# Patient Record
Sex: Female | Born: 1950 | Race: Black or African American | Hispanic: No | Marital: Married | Smoking: Never smoker
Health system: Southern US, Community
[De-identification: ages and names within clinical notes are randomized; demographics above are authoritative.]

## PROBLEM LIST (undated history)

## (undated) DIAGNOSIS — E876 Hypokalemia: Secondary | ICD-10-CM

## (undated) DIAGNOSIS — Z8739 Personal history of other diseases of the musculoskeletal system and connective tissue: Secondary | ICD-10-CM

## (undated) DIAGNOSIS — E559 Vitamin D deficiency, unspecified: Secondary | ICD-10-CM

## (undated) DIAGNOSIS — J45909 Unspecified asthma, uncomplicated: Secondary | ICD-10-CM

## (undated) DIAGNOSIS — E7800 Pure hypercholesterolemia, unspecified: Secondary | ICD-10-CM

## (undated) DIAGNOSIS — D219 Benign neoplasm of connective and other soft tissue, unspecified: Secondary | ICD-10-CM

## (undated) DIAGNOSIS — E870 Hyperosmolality and hypernatremia: Secondary | ICD-10-CM

## (undated) DIAGNOSIS — I10 Essential (primary) hypertension: Secondary | ICD-10-CM

## (undated) DIAGNOSIS — T7840XA Allergy, unspecified, initial encounter: Secondary | ICD-10-CM

## (undated) DIAGNOSIS — J301 Allergic rhinitis due to pollen: Secondary | ICD-10-CM

## (undated) DIAGNOSIS — R002 Palpitations: Secondary | ICD-10-CM

## (undated) HISTORY — DX: Palpitations: R00.2

## (undated) HISTORY — DX: Vitamin D deficiency, unspecified: E55.9

## (undated) HISTORY — DX: Hypokalemia: E87.6

## (undated) HISTORY — DX: Pure hypercholesterolemia, unspecified: E78.00

## (undated) HISTORY — DX: Essential (primary) hypertension: I10

## (undated) HISTORY — DX: Benign neoplasm of connective and other soft tissue, unspecified: D21.9

## (undated) HISTORY — DX: Personal history of other diseases of the musculoskeletal system and connective tissue: Z87.39

## (undated) HISTORY — DX: Allergy, unspecified, initial encounter: T78.40XA

## (undated) HISTORY — DX: Hyperosmolality and hypernatremia: E87.0

## (undated) HISTORY — DX: Allergic rhinitis due to pollen: J30.1

## (undated) HISTORY — PX: WISDOM TOOTH EXTRACTION: SHX21

## (undated) HISTORY — PX: TONSILLECTOMY: SUR1361

## (undated) HISTORY — DX: Unspecified asthma, uncomplicated: J45.909

---
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
## 1998-01-08 ENCOUNTER — Ambulatory Visit (HOSPITAL_COMMUNITY): Admission: RE | Admit: 1998-01-08 | Discharge: 1998-01-08 | Payer: Self-pay | Admitting: Internal Medicine

## 1998-02-10 ENCOUNTER — Ambulatory Visit (HOSPITAL_COMMUNITY): Admission: RE | Admit: 1998-02-10 | Discharge: 1998-02-10 | Payer: Self-pay | Admitting: Internal Medicine

## 2000-11-19 ENCOUNTER — Other Ambulatory Visit: Admission: RE | Admit: 2000-11-19 | Discharge: 2000-11-19 | Payer: Self-pay | Admitting: Obstetrics and Gynecology

## 2001-02-13 ENCOUNTER — Encounter: Admission: RE | Admit: 2001-02-13 | Discharge: 2001-02-13 | Payer: Self-pay | Admitting: Internal Medicine

## 2001-02-13 ENCOUNTER — Encounter: Payer: Self-pay | Admitting: Internal Medicine

## 2001-11-19 ENCOUNTER — Other Ambulatory Visit: Admission: RE | Admit: 2001-11-19 | Discharge: 2001-11-19 | Payer: Self-pay | Admitting: Obstetrics and Gynecology

## 2002-02-14 ENCOUNTER — Ambulatory Visit (HOSPITAL_COMMUNITY): Admission: RE | Admit: 2002-02-14 | Discharge: 2002-02-14 | Payer: Self-pay | Admitting: Obstetrics and Gynecology

## 2002-02-14 ENCOUNTER — Encounter: Payer: Self-pay | Admitting: Obstetrics and Gynecology

## 2002-04-28 ENCOUNTER — Ambulatory Visit (HOSPITAL_COMMUNITY): Admission: RE | Admit: 2002-04-28 | Discharge: 2002-04-28 | Payer: Self-pay | Admitting: Internal Medicine

## 2002-12-10 ENCOUNTER — Other Ambulatory Visit: Admission: RE | Admit: 2002-12-10 | Discharge: 2002-12-10 | Payer: Self-pay | Admitting: Obstetrics and Gynecology

## 2003-02-17 ENCOUNTER — Encounter: Payer: Self-pay | Admitting: Obstetrics and Gynecology

## 2003-02-17 ENCOUNTER — Ambulatory Visit (HOSPITAL_COMMUNITY): Admission: RE | Admit: 2003-02-17 | Discharge: 2003-02-17 | Payer: Self-pay | Admitting: Obstetrics and Gynecology

## 2003-06-22 ENCOUNTER — Ambulatory Visit (HOSPITAL_COMMUNITY): Admission: RE | Admit: 2003-06-22 | Discharge: 2003-06-22 | Payer: Self-pay | Admitting: Gastroenterology

## 2003-12-11 ENCOUNTER — Other Ambulatory Visit: Admission: RE | Admit: 2003-12-11 | Discharge: 2003-12-11 | Payer: Self-pay | Admitting: Obstetrics and Gynecology

## 2004-02-03 ENCOUNTER — Encounter: Admission: RE | Admit: 2004-02-03 | Discharge: 2004-02-03 | Payer: Self-pay | Admitting: Internal Medicine

## 2004-02-19 ENCOUNTER — Ambulatory Visit (HOSPITAL_COMMUNITY): Admission: RE | Admit: 2004-02-19 | Discharge: 2004-02-19 | Payer: Self-pay | Admitting: Obstetrics and Gynecology

## 2004-12-14 ENCOUNTER — Other Ambulatory Visit: Admission: RE | Admit: 2004-12-14 | Discharge: 2004-12-14 | Payer: Self-pay | Admitting: Obstetrics and Gynecology

## 2005-02-28 ENCOUNTER — Ambulatory Visit (HOSPITAL_COMMUNITY): Admission: RE | Admit: 2005-02-28 | Discharge: 2005-02-28 | Payer: Self-pay | Admitting: Obstetrics and Gynecology

## 2005-03-07 ENCOUNTER — Encounter: Admission: RE | Admit: 2005-03-07 | Discharge: 2005-03-07 | Payer: Self-pay | Admitting: Obstetrics and Gynecology

## 2005-05-02 ENCOUNTER — Encounter: Admission: RE | Admit: 2005-05-02 | Discharge: 2005-05-02 | Payer: Self-pay | Admitting: Internal Medicine

## 2005-12-15 ENCOUNTER — Other Ambulatory Visit: Admission: RE | Admit: 2005-12-15 | Discharge: 2005-12-15 | Payer: Self-pay | Admitting: Obstetrics and Gynecology

## 2006-03-01 ENCOUNTER — Ambulatory Visit (HOSPITAL_COMMUNITY): Admission: RE | Admit: 2006-03-01 | Discharge: 2006-03-01 | Payer: Self-pay | Admitting: Obstetrics and Gynecology

## 2007-03-04 ENCOUNTER — Ambulatory Visit (HOSPITAL_COMMUNITY): Admission: RE | Admit: 2007-03-04 | Discharge: 2007-03-04 | Payer: Self-pay | Admitting: Obstetrics and Gynecology

## 2008-03-04 ENCOUNTER — Ambulatory Visit (HOSPITAL_COMMUNITY): Admission: RE | Admit: 2008-03-04 | Discharge: 2008-03-04 | Payer: Self-pay | Admitting: Obstetrics and Gynecology

## 2008-05-06 ENCOUNTER — Encounter: Admission: RE | Admit: 2008-05-06 | Discharge: 2008-05-06 | Payer: Self-pay | Admitting: Internal Medicine

## 2009-03-08 ENCOUNTER — Ambulatory Visit (HOSPITAL_COMMUNITY): Admission: RE | Admit: 2009-03-08 | Discharge: 2009-03-08 | Payer: Self-pay | Admitting: Obstetrics and Gynecology

## 2009-10-12 ENCOUNTER — Encounter: Admission: RE | Admit: 2009-10-12 | Discharge: 2009-10-12 | Payer: Self-pay | Admitting: Internal Medicine

## 2010-03-15 ENCOUNTER — Ambulatory Visit (HOSPITAL_COMMUNITY): Admission: RE | Admit: 2010-03-15 | Discharge: 2010-03-15 | Payer: Self-pay | Admitting: Obstetrics and Gynecology

## 2010-09-28 ENCOUNTER — Encounter: Payer: Self-pay | Admitting: Internal Medicine

## 2010-10-28 NOTE — Op Note (Signed)
NAME:  Lynn Gardner, Lynn Gardner                      ACCOUNT NO.:  [id number]   MEDICAL RECORD NO.:  [id number]                   PATIENT TYPE:  AMB   LOCATION:  ENDO                                 FACILITY:  [facility]   PHYSICIAN:  Graylin Shiver, M.D.                DATE OF BIRTH:  1951/06/07   DATE OF PROCEDURE:  06/22/2003  DATE OF DISCHARGE:                                 OPERATIVE REPORT   PROCEDURE PERFORMED:  Colonoscopy.   INDICATIONS FOR PROCEDURE:  Screening.   Informed consent was obtained after explanation of the risks of bleeding,  infection, and perforation.   PREMEDICATIONS:  Fentanyl 60 mcg  IV, Versed 6 mg IV.   DESCRIPTION OF PROCEDURE:  With the patient in the left lateral decubitus  position, a rectal exam was performed and no masses were felt.  The Olympus  colonoscope was inserted into the rectum and advanced around the colon to  the cecum.  Cecal landmarks were identified.  The cecum and ascending colon  were normal.  The transverse colon normal.  The descending colon, sigmoid  and rectum were normal.  The patient tolerated the procedure well without  complications.   IMPRESSION:  Normal colonoscopy to the cecum.   RECOMMENDATIONS:  I would recommend follow-up colonoscopy  again in 10  years.                                               Graylin Shiver, M.D.    Lynn Gardner  D:  06/22/2003  T:  06/22/2003  Job:  [id number]

## 2011-02-09 ENCOUNTER — Other Ambulatory Visit: Payer: Self-pay | Admitting: Internal Medicine

## 2011-02-09 ENCOUNTER — Ambulatory Visit
Admission: RE | Admit: 2011-02-09 | Discharge: 2011-02-09 | Disposition: A | Discharge status: Home / Self Care | Payer: 59 | Source: Ambulatory Visit | Attending: Internal Medicine | Admitting: Internal Medicine

## 2011-02-09 DIAGNOSIS — I10 Essential (primary) hypertension: Secondary | ICD-10-CM

## 2011-02-17 ENCOUNTER — Other Ambulatory Visit (HOSPITAL_COMMUNITY): Payer: Self-pay | Admitting: Obstetrics and Gynecology

## 2011-02-17 DIAGNOSIS — Z1231 Encounter for screening mammogram for malignant neoplasm of breast: Secondary | ICD-10-CM

## 2011-03-20 ENCOUNTER — Ambulatory Visit (HOSPITAL_COMMUNITY)
Admission: RE | Admit: 2011-03-20 | Discharge: 2011-03-20 | Disposition: A | Discharge status: Home / Self Care | Payer: 59 | Source: Ambulatory Visit | Attending: Obstetrics and Gynecology | Admitting: Obstetrics and Gynecology

## 2011-03-20 DIAGNOSIS — Z1231 Encounter for screening mammogram for malignant neoplasm of breast: Secondary | ICD-10-CM | POA: Insufficient documentation

## 2012-02-02 ENCOUNTER — Other Ambulatory Visit: Payer: Self-pay | Admitting: Obstetrics and Gynecology

## 2012-02-02 DIAGNOSIS — Z1231 Encounter for screening mammogram for malignant neoplasm of breast: Secondary | ICD-10-CM

## 2012-02-19 ENCOUNTER — Encounter: Payer: Self-pay | Admitting: Obstetrics and Gynecology

## 2012-02-19 ENCOUNTER — Ambulatory Visit (INDEPENDENT_AMBULATORY_CARE_PROVIDER_SITE_OTHER): Payer: 59 | Admitting: Obstetrics and Gynecology

## 2012-02-19 VITALS — BP 120/72 | HR 70 | Ht 63.25 in | Wt 143.0 lb

## 2012-02-19 DIAGNOSIS — Z01419 Encounter for gynecological examination (general) (routine) without abnormal findings: Secondary | ICD-10-CM

## 2012-02-19 DIAGNOSIS — Z124 Encounter for screening for malignant neoplasm of cervix: Secondary | ICD-10-CM

## 2012-02-19 NOTE — Progress Notes (Signed)
Regular Periods: no Mammogram: yes  Monthly Breast Ex.: yes Exercise: yes  Tetanus < 10 years: yes Seatbelts: yes  NI. Bladder Functn.: yes Abuse at home: no  Daily BM's: yes Stressful Work: yes  Healthy Diet: yes Sigmoid-Colonoscopy: 9 YEARS  Calcium: no Medical problems this year: NO PROBLEMS   LAST PAP:2012 NLE Contraception: NONE  Mammogram:  2012 NL  PCP: DR.DEAN  PMH: NO CHANGE  FMH: NO CHANGE  Last Bone Scan: 2008 OSTEOPENIA

## 2012-02-19 NOTE — Progress Notes (Signed)
Subjective:    Lynn Gardner is a 61 y.o. female G1P0 who presents for annual exam. The patient reports that over the past year her husband has had seizures in the middle of the night attributed to medication that drops his blood pressure.  Consequently she has developed a hypervigilance and some anxiety during her sleeping hours anytime he makes a noise or moves.  Discussed with patient possible help with this response through a behavioral therapist.   Review of Systems Gastrointestinal:No change in bowel habits, no abdominal pain, no rectal bleeding Genitourinary:negative for abnormal vaginal bleeding,  dysuria, frequency, hematuria, nocturia and urinary incontinence   Objective:     BP 120/72  Pulse 70  Ht 5' 3.25" (1.607 m)  Wt 143 lb (64.864 kg)  BMI 25.13 kg/m2 Weight:  Wt Readings from Last 1 Encounters:  02/19/12 143 lb (64.864 kg)   Body mass index is 25.13 kg/(m^2). General Appearance:  Well nourished in no acute distress HEENT: Grossly normal Neck / Thyroid: Supple, no masses, nodes or enlargement Lungs: Clear to auscultation bilaterally Back: No CVA tenderness Breast Exam: No masses or nodes.No dimpling, nipple retraction or discharge. Cardiovascular: Regular rate and rhythm.  Gastrointestinal: Soft, non-tender, no masses or organomegaly Pelvic Exam: EGBUS-mild atrophyl, vagina-mildly atrophic mucosa without lesions, cervix-no tenderness or lesions, uterus-appears normal size, shape and consistency, adnexae-no masses or tenderness Rectovaginal: Normal sphincter tone and  no masses  Lymphatic Exam: Non-palpable nodes in neck, clavicular, axillary, or inguinal regions Skin: No rash or abnormalities Extremities: no clubbing cyanosis or edema Neurologic: Grossly normal Psychiatric: Alert and oriented x 3    Assessment:   Routine GYN Exam   Plan:   PAP sent-patient request  RTO 1 year or prn  Patient to pursue behavioral therapist  Reviewed revised  guidelines for PAP smear maintenance schedule.   Cordelia Bessinger,ELMIRAPA-C

## 2012-02-20 LAB — PAP IG W/ RFLX HPV ASCU

## 2012-03-22 ENCOUNTER — Ambulatory Visit (HOSPITAL_COMMUNITY)
Admission: RE | Admit: 2012-03-22 | Discharge: 2012-03-22 | Disposition: A | Discharge status: Home / Self Care | Payer: 59 | Source: Ambulatory Visit | Attending: Obstetrics and Gynecology | Admitting: Obstetrics and Gynecology

## 2012-03-22 DIAGNOSIS — Z1231 Encounter for screening mammogram for malignant neoplasm of breast: Secondary | ICD-10-CM | POA: Insufficient documentation

## 2012-07-27 ENCOUNTER — Other Ambulatory Visit: Payer: Self-pay

## 2012-08-10 ENCOUNTER — Encounter: Payer: Self-pay | Admitting: Internal Medicine

## 2012-08-10 ENCOUNTER — Encounter: Payer: Self-pay | Admitting: Hematology

## 2013-02-12 ENCOUNTER — Other Ambulatory Visit: Payer: Self-pay | Admitting: Obstetrics and Gynecology

## 2013-02-12 DIAGNOSIS — Z1231 Encounter for screening mammogram for malignant neoplasm of breast: Secondary | ICD-10-CM

## 2013-03-27 ENCOUNTER — Other Ambulatory Visit: Payer: Self-pay | Admitting: Obstetrics and Gynecology

## 2013-03-27 ENCOUNTER — Ambulatory Visit (HOSPITAL_COMMUNITY)
Admission: RE | Admit: 2013-03-27 | Discharge: 2013-03-27 | Disposition: A | Discharge status: Home / Self Care | Payer: 59 | Source: Ambulatory Visit | Attending: Obstetrics and Gynecology | Admitting: Obstetrics and Gynecology

## 2013-03-27 DIAGNOSIS — Z1231 Encounter for screening mammogram for malignant neoplasm of breast: Secondary | ICD-10-CM

## 2013-04-17 ENCOUNTER — Other Ambulatory Visit: Payer: Self-pay

## 2013-05-28 ENCOUNTER — Encounter (HOSPITAL_COMMUNITY): Payer: Self-pay | Admitting: Emergency Medicine

## 2013-05-28 ENCOUNTER — Emergency Department (HOSPITAL_COMMUNITY)
Admission: EM | Admit: 2013-05-28 | Discharge: 2013-05-28 | Disposition: A | Discharge status: Home / Self Care | Payer: 59 | Source: Home / Self Care

## 2013-05-28 DIAGNOSIS — H6122 Impacted cerumen, left ear: Secondary | ICD-10-CM

## 2013-05-28 DIAGNOSIS — H6120 Impacted cerumen, unspecified ear: Secondary | ICD-10-CM

## 2013-05-28 NOTE — ED Provider Notes (Signed)
CSN: [id number]     Arrival date & time 05/28/13  4098 History   First MD Initiated Contact with Patient 05/28/13 1045     Chief Complaint  Patient presents with  . Cerumen Impaction   (Consider location/radiation/quality/duration/timing/severity/associated sxs/prior Treatment) HPI Comments: This 62 year old female was seen by her PCP 2 days ago and told that she had some wax in the left ear. He advised that she go to the urgent care to have it irrigated. She has no pain. She does have mildly decreased hearing in the left ear.   Past Medical History  Diagnosis Date  . Essential hypertension, malignant   . Pure hypercholesterolemia   . Allergic rhinitis due to pollen   . Extrinsic asthma, unspecified   . Hypopotassemia   . Hyperosmolality and/or hypernatremia   . Palpitations   . Fibroids   . H/O osteopenia   . Vitamin D deficiency    Past Surgical History  Procedure Laterality Date  . Appendectomy  1972  . Wisdom tooth extraction    . Tonsillectomy     Family History  Problem Relation Age of Onset  . Cancer Maternal Grandmother   . Hypertension Maternal Grandmother   . Cancer Father   . Heart disease Mother     MITRAL VALVE PROLAPSE   History  Substance Use Topics  . Smoking status: Never Smoker   . Smokeless tobacco: Never Used  . Alcohol Use: Yes   OB History   Grav Para Term Preterm Abortions TAB SAB Ect Mult Living   1         0     Review of Systems  Constitutional: Negative.   HENT: Negative for ear discharge and ear pain.   Respiratory: Negative.   Neurological: Negative for dizziness, speech difficulty and headaches.    Allergies  Nisoldipine er; Penicillins; and Tessalon perles  Home Medications   Current Outpatient Rx  Name  Route  Sig  Dispense  Refill  . amLODipine (NORVASC) 5 MG tablet   Oral   Take 5 mg by mouth daily. 1/2 tab          . fexofenadine (ALLEGRA) 60 MG tablet   Oral   Take 60 mg by mouth 2 (two) times daily.         Marland Kitchen azelastine (ASTELIN) 137 MCG/SPRAY nasal spray   Nasal   1 spray by Nasal route daily. Use in each nostril as directed          . cholecalciferol (VITAMIN D) 1000 UNITS tablet   Oral   Take 1,000 Units by mouth daily.         . mometasone (NASONEX) 50 MCG/ACT nasal spray   Nasal   2 sprays by Nasal route daily.           . montelukast (SINGULAIR) 10 MG tablet   Oral   Take 10 mg by mouth daily.           . Potassium Chloride (KLOR-CON PO)   Oral   Take 20 mg by mouth daily.           . TRIAMTERENE-HCTZ PO   Oral   Take 25 mg by mouth daily.           BP 138/86  Pulse 69  Temp(Src) 97.7 F (36.5 C) (Oral)  Resp 16  SpO2 99% Physical Exam  Nursing note and vitals reviewed. Constitutional: She is oriented to person, place, and time. She appears well-developed and well-nourished. No  distress.  HENT:  Head: Normocephalic and atraumatic.  Right Ear: External ear normal.  Mouth/Throat: Oropharynx is clear and moist.  Left EAC with partial cerumen obstruction of the TM. No bleeding, swelling or discharge.  Eyes: Conjunctivae and EOM are normal.  Neck: Normal range of motion. Neck supple.  Pulmonary/Chest: Effort normal.  Lymphadenopathy:    She has no cervical adenopathy.  Neurological: She is alert and oriented to person, place, and time.  Skin: Skin is warm and dry. She is not diaphoretic.    ED Course  EAR CERUMEN REMOVAL Date/Time: 05/28/2013 10:55 AM Performed by: Phineas Real, Rand Etchison Authorized by: Clementeen Graham, S Consent: Verbal consent obtained. Risks and benefits: risks, benefits and alternatives were discussed Consent given by: patient Patient understanding: patient states understanding of the procedure being performed Patient identity confirmed: verbally with patient Local anesthetic: none Location details: left ear Procedure type: irrigation Patient tolerance: Patient tolerated the procedure well with no immediate complications.   (including  critical care time) Labs Review Labs Reviewed - No data to display Imaging Review No results found.    MDM   1. Excessive cerumen in left ear canal    Irrigate L EAC. Til clear. F/U with PCP    Hayden Rasmussen, NP 05/28/13 1059

## 2013-05-28 NOTE — ED Notes (Signed)
States she saw her MD yesterday, Willey Blade) who told her her left ear needs to be flushed, and sent her here to have it done

## 2013-05-29 NOTE — ED Provider Notes (Signed)
Medical screening examination/treatment/procedure(s) were performed by a resident physician or non-physician practitioner and as the supervising physician I was immediately available for consultation/collaboration.  Evan Corey, MD   Evan S Corey, MD 05/29/13 0824 

## 2013-06-05 ENCOUNTER — Encounter (HOSPITAL_BASED_OUTPATIENT_CLINIC_OR_DEPARTMENT_OTHER): Payer: Self-pay | Admitting: Emergency Medicine

## 2013-06-05 ENCOUNTER — Emergency Department (HOSPITAL_BASED_OUTPATIENT_CLINIC_OR_DEPARTMENT_OTHER)
Admission: EM | Admit: 2013-06-05 | Discharge: 2013-06-05 | Disposition: A | Discharge status: Home / Self Care | Payer: 59 | Attending: Emergency Medicine | Admitting: Emergency Medicine

## 2013-06-05 DIAGNOSIS — Y998 Other external cause status: Secondary | ICD-10-CM | POA: Insufficient documentation

## 2013-06-05 DIAGNOSIS — W260XXA Contact with knife, initial encounter: Secondary | ICD-10-CM | POA: Insufficient documentation

## 2013-06-05 DIAGNOSIS — Z8742 Personal history of other diseases of the female genital tract: Secondary | ICD-10-CM | POA: Insufficient documentation

## 2013-06-05 DIAGNOSIS — S61209A Unspecified open wound of unspecified finger without damage to nail, initial encounter: Secondary | ICD-10-CM | POA: Insufficient documentation

## 2013-06-05 DIAGNOSIS — Z862 Personal history of diseases of the blood and blood-forming organs and certain disorders involving the immune mechanism: Secondary | ICD-10-CM | POA: Insufficient documentation

## 2013-06-05 DIAGNOSIS — Y9289 Other specified places as the place of occurrence of the external cause: Secondary | ICD-10-CM | POA: Insufficient documentation

## 2013-06-05 DIAGNOSIS — Z8639 Personal history of other endocrine, nutritional and metabolic disease: Secondary | ICD-10-CM | POA: Insufficient documentation

## 2013-06-05 DIAGNOSIS — J45909 Unspecified asthma, uncomplicated: Secondary | ICD-10-CM | POA: Insufficient documentation

## 2013-06-05 DIAGNOSIS — S61012A Laceration without foreign body of left thumb without damage to nail, initial encounter: Secondary | ICD-10-CM

## 2013-06-05 DIAGNOSIS — I10 Essential (primary) hypertension: Secondary | ICD-10-CM | POA: Insufficient documentation

## 2013-06-05 DIAGNOSIS — Y9389 Activity, other specified: Secondary | ICD-10-CM | POA: Insufficient documentation

## 2013-06-05 NOTE — ED Notes (Signed)
Laceration to L thumb this morning while cooking

## 2013-06-05 NOTE — ED Notes (Signed)
Suture cart is outside of the patient's room. It is set up and ready for the doctor to use.

## 2013-06-05 NOTE — ED Provider Notes (Signed)
TIME SEEN: 1:00 PM  CHIEF COMPLAINT: Left thumb laceration  HPI: Patient is a 62 year old female with a history of hypertension, hyperlipidemia who presents to the emergency department with a left thumb laceration. She reports that she was cutting potatoes today with a new knife when she cut the tip of her left thumb. She states her tetanus is within the last 5 years. No other injury.  ROS: See HPI Constitutional: no fever  Eyes: no drainage  ENT: no runny nose   Cardiovascular:  no chest pain  Resp: no SOB  GI: no vomiting GU: no dysuria Integumentary: no rash  Allergy: no hives  Musculoskeletal: no leg swelling  Neurological: no slurred speech ROS otherwise negative  PAST MEDICAL HISTORY/PAST SURGICAL HISTORY:  Past Medical History  Diagnosis Date  . Essential hypertension, malignant   . Pure hypercholesterolemia   . Allergic rhinitis due to pollen   . Extrinsic asthma, unspecified   . Hypopotassemia   . Hyperosmolality and/or hypernatremia   . Palpitations   . Fibroids   . H/O osteopenia   . Vitamin D deficiency     MEDICATIONS:  Prior to Admission medications   Medication Sig Start Date End Date Taking? Authorizing Provider  amLODipine (NORVASC) 5 MG tablet Take 5 mg by mouth daily. 1/2 tab     Historical Provider, MD  azelastine (ASTELIN) 137 MCG/SPRAY nasal spray 1 spray by Nasal route daily. Use in each nostril as directed     Historical Provider, MD  cholecalciferol (VITAMIN D) 1000 UNITS tablet Take 1,000 Units by mouth daily.    Historical Provider, MD  fexofenadine (ALLEGRA) 60 MG tablet Take 60 mg by mouth 2 (two) times daily.    Historical Provider, MD  mometasone (NASONEX) 50 MCG/ACT nasal spray 2 sprays by Nasal route daily.      Historical Provider, MD  montelukast (SINGULAIR) 10 MG tablet Take 10 mg by mouth daily.      Historical Provider, MD  Potassium Chloride (KLOR-CON PO) Take 20 mg by mouth daily.      Historical Provider, MD  TRIAMTERENE-HCTZ PO  Take 25 mg by mouth daily.     Historical Provider, MD    ALLERGIES:  Allergies  Allergen Reactions  . Nisoldipine Er     Headaches  . Penicillins   . Tessalon Perles     SOCIAL HISTORY:  History  Substance Use Topics  . Smoking status: Never Smoker   . Smokeless tobacco: Never Used  . Alcohol Use: Yes    FAMILY HISTORY: Family History  Problem Relation Age of Onset  . Cancer Maternal Grandmother   . Hypertension Maternal Grandmother   . Cancer Father   . Heart disease Mother     MITRAL VALVE PROLAPSE    EXAM: BP 146/68  Pulse 79  Temp(Src) 98.8 F (37.1 C) (Oral)  Resp 16  SpO2 99% CONSTITUTIONAL: Alert and oriented and responds appropriately to questions. Well-appearing; well-nourished HEAD: Normocephalic EYES: Conjunctivae clear, PERRL ENT: normal nose; no rhinorrhea; moist mucous membranes; pharynx without lesions noted NECK: Supple, no meningismus, no LAD  CARD: RRR; S1 and S2 appreciated; no murmurs, no clicks, no rubs, no gallops RESP: Normal chest excursion without splinting or tachypnea; breath sounds clear and equal bilaterally; no wheezes, no rhonchi, no rales,  ABD/GI: Normal bowel sounds; non-distended; soft, non-tender, no rebound, no guarding BACK:  The back appears normal and is non-tender to palpation, there is no CVA tenderness EXT: Normal ROM in all joints; non-tender to palpation;  no edema; normal capillary refill; no cyanosis    SKIN: Normal color for age and race; warm; patient has a 4 cm superficial laceration to the tip of her left thumb it does not involve her inhalers for bed, sensation to light touch intact diffusely, normal range of motion in all joints, normal capillary refill, 2+ radial pulses bilaterally NEURO: Moves all extremities equally PSYCH: The patient's mood and manner are appropriate. Grooming and personal hygiene are appropriate.  MEDICAL DECISION MAKING: Patient with left thumb laceration. Repaired in the ED. We'll discharge  home with return precautions, supportive care instructions, PCP followup. Tetanus up-to-date.      LACERATION REPAIR Performed by: Raelyn Number Authorized by: Raelyn Number Consent: Verbal consent obtained. Risks and benefits: risks, benefits and alternatives were discussed Consent given by: patient Patient identity confirmed: provided demographic data Prepped and Draped in normal sterile fashion Wound explored  Laceration Location: Left thumb  Laceration Length: 4 cm  No Foreign Bodies seen or palpated  Anesthesia: local infiltration  Local anesthetic: lidocaine 1 % without epinephrine  Anesthetic total: 6 ml; digital block   Irrigation method: syringe Amount of cleaning: standard  Skin closure: Simple interrupted, superficial   Number of sutures: 4   Technique: 4.0 Prolene, 4 simple interrupted superficial sutures   Patient tolerance: Patient tolerated the procedure well with no immediate complications.   Layla Maw Everline Mahaffy, DO 06/05/13 1314

## 2014-03-02 ENCOUNTER — Other Ambulatory Visit (HOSPITAL_COMMUNITY): Payer: Self-pay | Admitting: Obstetrics and Gynecology

## 2014-03-02 DIAGNOSIS — Z1231 Encounter for screening mammogram for malignant neoplasm of breast: Secondary | ICD-10-CM

## 2014-03-31 ENCOUNTER — Ambulatory Visit (HOSPITAL_COMMUNITY)
Admission: RE | Admit: 2014-03-31 | Discharge: 2014-03-31 | Disposition: A | Discharge status: Home / Self Care | Payer: 59 | Source: Ambulatory Visit | Attending: Obstetrics and Gynecology | Admitting: Obstetrics and Gynecology

## 2014-03-31 DIAGNOSIS — Z1231 Encounter for screening mammogram for malignant neoplasm of breast: Secondary | ICD-10-CM | POA: Diagnosis not present

## 2014-04-13 ENCOUNTER — Encounter (HOSPITAL_BASED_OUTPATIENT_CLINIC_OR_DEPARTMENT_OTHER): Payer: Self-pay | Admitting: Emergency Medicine

## 2014-10-29 ENCOUNTER — Ambulatory Visit
Admission: RE | Admit: 2014-10-29 | Discharge: 2014-10-29 | Disposition: A | Discharge status: Home / Self Care | Payer: 59 | Source: Ambulatory Visit | Attending: Internal Medicine | Admitting: Internal Medicine

## 2014-10-29 ENCOUNTER — Other Ambulatory Visit: Payer: Self-pay | Admitting: Internal Medicine

## 2014-10-29 DIAGNOSIS — M25561 Pain in right knee: Secondary | ICD-10-CM

## 2015-04-28 ENCOUNTER — Encounter: Payer: Self-pay | Admitting: Family Medicine

## 2015-04-28 ENCOUNTER — Ambulatory Visit (INDEPENDENT_AMBULATORY_CARE_PROVIDER_SITE_OTHER): Payer: 59 | Admitting: Family Medicine

## 2015-04-28 VITALS — BP 126/53 | Ht 63.0 in | Wt 143.0 lb

## 2015-04-28 DIAGNOSIS — M25561 Pain in right knee: Secondary | ICD-10-CM

## 2015-04-28 NOTE — Assessment & Plan Note (Signed)
Lateral distal aspect not exactly joint line. Differential includes small tibial plateau fracture versus bony contusion versus IT band syndrome. The fact that this does not worsen with prolonged activity makes IT band syndrome less likely. -Obtain 3 view of the tibia including AP lateral and intercondylar notch view -Fitted with knee sleeve take NSAIDs and ice when necessary along with hip strengthening exercises. -If no improvement in 1 month we will consider CT scan versus MRI.

## 2015-04-28 NOTE — Progress Notes (Signed)
  Lynn Gardner - 64 y.o. female MRN [id number]  Date of birth: 23-Apr-1951  SUBJECTIVE:  Including CC & ROS.  Lynn Gardner is a 64 y.o. female who presents today for right lateral knee pain.    Knee Pain right lateral, initial visit - patient presents today for ongoing right lateral knee pain slightly distal to the joint. She has had generalized knee pain since 6-9 months ago when she had an x-ray done showing minimal osteoarthritis of the medial compartment. However she did have a fall about 2 months ago onto the right knee after a 5K race. She did have swelling in this area for several weeks but dissipated. She has been able to run since then and it does not bother her with running. Pain is worse if she has any pressure on the area but otherwise cannot incite pain. Denies ongoing swelling and is relieved by over-the-counter medication at times.  PMHx - Updated and reviewed.  Contributory factors include: Noncontributory PSHx - Updated and reviewed.  Contributory factors include:  Noncontributory FHx - Updated and reviewed.  Contributory factors include:  Noncontributory Medications - updated and reviewed   12 point ROS negative other than per HPI.   Exam:  Filed Vitals:   04/28/15 1334  BP: 126/53    Gen: NAD, AAO 3 Cardiorespiratory - Normal respiratory effort/rate.  RRR Skin: No rashes or erythema Extremities: No edema, pulses +2 bilateral upper and lower extremity R Knee:  Normal to inspection with no erythema or effusion or obvious bony abnormalities.  No obvious Baker's cysts Palpation normal with no warmth or joint line tenderness or patellar tenderness or condyle tenderness.  No TTP along infrapatellar or pes anserine bursas.  Small TTP lateral proximal tibia near Gerdy's tubercle  ROM normal in flexion (135 degrees) and extension (0 degrees) and lower leg rotation. Ligaments with solid consistent endpoints including ACL, PCL, LCL, MCL.  Negative Anterior  Drawer/Lachman/Pivot Shift Negative Mcmurray's and provocative meniscal tests including Thessaly and Apley compression testing  Non painful patellar compression.  Normal Patellar glide.  No apprehension  Patellar and quadriceps tendons unremarkable. Hamstring and quadriceps strength is normal. Negative Noble and Ober test   Neurovascularly intact B/L LE   Imaging:  Reviewed X-ray from 10/26/14 showing minimal OA

## 2015-05-04 ENCOUNTER — Ambulatory Visit
Admission: RE | Admit: 2015-05-04 | Discharge: 2015-05-04 | Disposition: A | Discharge status: Home / Self Care | Payer: 59 | Source: Ambulatory Visit | Attending: Family Medicine | Admitting: Family Medicine

## 2015-05-04 DIAGNOSIS — M25561 Pain in right knee: Secondary | ICD-10-CM

## 2015-07-02 DIAGNOSIS — J309 Allergic rhinitis, unspecified: Secondary | ICD-10-CM | POA: Diagnosis not present

## 2015-07-02 DIAGNOSIS — I10 Essential (primary) hypertension: Secondary | ICD-10-CM | POA: Diagnosis not present

## 2015-07-02 DIAGNOSIS — Z6826 Body mass index (BMI) 26.0-26.9, adult: Secondary | ICD-10-CM | POA: Diagnosis not present

## 2015-07-02 DIAGNOSIS — F439 Reaction to severe stress, unspecified: Secondary | ICD-10-CM | POA: Diagnosis not present

## 2015-08-11 MED FILL — TRIAMTERENE-HCTZ 37.5-25 MG: 37.5-25 | 90 days supply | Qty: 90 | Fill #1

## 2015-08-11 MED FILL — AMLODIPINE BESYLATE 5 MG TA: 5 | 90 days supply | Qty: 135 | Fill #1

## 2015-08-11 MED FILL — POTASSIUM CL ER 20 MEQ TAB: 20 | 90 days supply | Qty: 180 | Fill #1

## 2015-09-17 MED FILL — CIPROFLOXACIN HCL 250 MG TA: 250 | 3 days supply | Qty: 6 | Fill #0

## 2015-09-23 DIAGNOSIS — I10 Essential (primary) hypertension: Secondary | ICD-10-CM | POA: Diagnosis not present

## 2015-10-15 DIAGNOSIS — N390 Urinary tract infection, site not specified: Secondary | ICD-10-CM | POA: Diagnosis not present

## 2015-10-15 DIAGNOSIS — I10 Essential (primary) hypertension: Secondary | ICD-10-CM | POA: Diagnosis not present

## 2015-10-15 DIAGNOSIS — J309 Allergic rhinitis, unspecified: Secondary | ICD-10-CM | POA: Diagnosis not present

## 2015-10-15 DIAGNOSIS — E785 Hyperlipidemia, unspecified: Secondary | ICD-10-CM | POA: Diagnosis not present

## 2015-11-23 MED FILL — KLOR-CON M20 TABLET: 20 | 90 days supply | Qty: 180 | Fill #2

## 2015-11-23 MED FILL — AMLODIPINE BESYLATE 5 MG TA: 5 | 90 days supply | Qty: 135 | Fill #0

## 2015-11-30 MED FILL — METHOCARBAMOL 500 MG TABLET: 500 | 10 days supply | Qty: 20 | Fill #0

## 2015-12-15 ENCOUNTER — Ambulatory Visit (INDEPENDENT_AMBULATORY_CARE_PROVIDER_SITE_OTHER): Payer: 59

## 2015-12-15 ENCOUNTER — Ambulatory Visit (INDEPENDENT_AMBULATORY_CARE_PROVIDER_SITE_OTHER): Payer: 59 | Admitting: Family Medicine

## 2015-12-15 VITALS — BP 146/78 | HR 74 | Temp 97.4°F | Resp 18 | Ht 63.0 in | Wt 148.8 lb

## 2015-12-15 DIAGNOSIS — M25559 Pain in unspecified hip: Secondary | ICD-10-CM | POA: Diagnosis not present

## 2015-12-15 DIAGNOSIS — M791 Myalgia: Secondary | ICD-10-CM

## 2015-12-15 DIAGNOSIS — M7918 Myalgia, other site: Secondary | ICD-10-CM

## 2015-12-15 DIAGNOSIS — S76311A Strain of muscle, fascia and tendon of the posterior muscle group at thigh level, right thigh, initial encounter: Secondary | ICD-10-CM | POA: Diagnosis not present

## 2015-12-15 MED ORDER — MELOXICAM 15 MG PO TABS: 15.0000 mg | ORAL_TABLET | Freq: Every day | ORAL | Status: AC

## 2015-12-15 MED ORDER — PREDNISONE 10 MG PO TABS: ORAL_TABLET | ORAL | Status: AC

## 2015-12-15 MED ORDER — CYCLOBENZAPRINE HCL 5 MG PO TABS: 5.0000 mg | ORAL_TABLET | Freq: Three times a day (TID) | ORAL | Status: AC | PRN

## 2015-12-15 MED FILL — predniSONE 10 MG TABS: 10 | 6 days supply | Qty: 21 | Fill #0

## 2015-12-15 MED FILL — MELOXICAM 15 MG TABLET: 15 | 30 days supply | Qty: 30 | Fill #0

## 2015-12-15 MED FILL — CYCLOBENZAPRINE 5 MG TABLET: 5 | 8 days supply | Qty: 40 | Fill #0

## 2015-12-15 NOTE — Progress Notes (Signed)
Subjective:  By signing my name below, I, [name], attest that this documentation has been prepared under the direction and in the presence of Delman Cheadle, MD. Electronically Signed: [name], Coffee. 12/15/2015 , 9:31 AM .  Patient was seen in Room 8 .   Patient ID: Lynn Gardner, female    DOB: May 25, 1951, 65 y.o.   MRN: [id number] Chief Complaint  Patient presents with  . Back Pain    x 2 wks/ right side pain going leg. pt taking muscle relaxer but unsure of the name    Back Pain Pertinent negatives include no abdominal pain, dysuria, fever, numbness or weakness.   Lynn Gardner is a 65 y.o. female who presents to [facility] complaining of hip pain radiating down right upper leg (over her gluteus and hamstring) that started 2 weeks ago. She believed she pulled a muscle when she pushed a box with her right foot. She states that it bothers her constantly and unable to find a comfortable position (hurts to sit, lay down, occasional spasms when standing). She denies any previous injuries to the area.   Patient had informed her PCP about 10 days ago and had Methocarbamol 500mg  called in. She tried taking aleve first week but didn't take any after the muscle relaxer. She's also been applying a heat pad and ice pack over the area with relief from heat pad. She denies pain in her lower back. She denies any changes in urination or bowels, weakness, numbness or foot pain.   She usually exercises with running but hasn't in about 6 months.   Past Medical History  Diagnosis Date  . Essential hypertension, malignant   . Pure hypercholesterolemia   . Allergic rhinitis due to pollen   . Extrinsic asthma, unspecified   . Hypopotassemia   . Hyperosmolality and/or hypernatremia   . Palpitations   . Fibroids   . H/O osteopenia   . Vitamin D deficiency   . Allergy    Prior to Admission medications   Medication Sig Start Date End Date Taking? Authorizing Provider  amLODipine (NORVASC)  5 MG tablet Take 5 mg by mouth daily. 1/2 tab    Yes Historical Provider, MD  azelastine (ASTELIN) 137 MCG/SPRAY nasal spray 1 spray by Nasal route daily. Use in each nostril as directed    Yes Historical Provider, MD  cholecalciferol (VITAMIN D) 1000 UNITS tablet Take 1,000 Units by mouth daily.   Yes Historical Provider, MD  fexofenadine (ALLEGRA) 60 MG tablet Take 60 mg by mouth 2 (two) times daily.   Yes Historical Provider, MD  mometasone (NASONEX) 50 MCG/ACT nasal spray 2 sprays by Nasal route daily.     Yes Historical Provider, MD  Potassium Chloride (KLOR-CON PO) Take 20 mg by mouth daily.     Yes Historical Provider, MD  potassium chloride SA (K-DUR,KLOR-CON) 20 MEQ tablet  02/02/15  Yes Historical Provider, MD  RECTICARE 5 % CREA  04/01/15  Yes Historical Provider, MD  TRIAMTERENE-HCTZ PO Take 25 mg by mouth daily.    Yes Historical Provider, MD  triamterene-hydrochlorothiazide (MAXZIDE-25) 37.5-25 MG tablet  03/08/15  Yes Historical Provider, MD  montelukast (SINGULAIR) 10 MG tablet Take 10 mg by mouth daily. Reported on 12/15/2015    Historical Provider, MD   Allergies  Allergen Reactions  . Nisoldipine Er     Headaches  . Penicillins   . Tessalon Perles     Review of Systems  Constitutional: Positive for activity change. Negative for fever,  chills and fatigue.  Cardiovascular: Negative for leg swelling.  Gastrointestinal: Negative for nausea, vomiting, abdominal pain, diarrhea and constipation.  Genitourinary: Negative for dysuria, urgency, flank pain, enuresis and difficulty urinating.  Musculoskeletal: Positive for myalgias and back pain. Negative for joint swelling, arthralgias and gait problem.  Skin: Negative for color change, rash and wound.  Neurological: Negative for weakness and numbness.  Psychiatric/Behavioral: Positive for sleep disturbance.       Objective:   Physical Exam  Constitutional: She is oriented to person, place, and time. She appears well-developed  and well-nourished. No distress.  HENT:  Head: Normocephalic and atraumatic.  Eyes: EOM are normal. Pupils are equal, round, and reactive to light.  Neck: Neck supple.  Cardiovascular: Normal rate.   Pulmonary/Chest: Effort normal. No respiratory distress.  Musculoskeletal: Normal range of motion.  No tenderness over the lumbar spine or the paraspinal muscles, forward flexion limited to approximately 30 degrees, moderate restriction in extension, negative straight leg raise bilaterally, severe pain in hamstring with any movement of either extremity, no significant tenderness over SI joint or the greater trochanter, most tender over the ischial spine, bilateral lower extremity strength 5/5, pain with use of right hip flexors but strength 5/5 no pain with quad and hamstring testings, full hip rom bilaterally  Neurological: She is alert and oriented to person, place, and time.  Reflex Scores:      Patellar reflexes are 2+ on the right side and 2+ on the left side.      Achilles reflexes are 2+ on the right side and 2+ on the left side. Skin: Skin is warm and dry.  Psychiatric: She has a normal mood and affect. Her behavior is normal.  Nursing note and vitals reviewed.   BP 146/78 mmHg  Pulse 74  Temp(Src) 97.4 F (36.3 C) (Oral)  Resp 18  Ht 5\' 3"  (1.6 m)  Wt 148 lb 12.8 oz (67.495 kg)  BMI 26.37 kg/m2  SpO2 100%   Dg Pelvis 1-2 Views  12/15/2015  CLINICAL DATA:  Hip pain radiating into the hamstring region which began 2 weeks ago possibly related to a muscle strain ; difficult to find a comfortable position. EXAM: PELVIS - 1-2 VIEW COMPARISON:  None in PACs FINDINGS: The bony pelvis is subjectively adequately mineralized for age. There is no acute or healing fracture. No avulsion from the inferior pubic rami is observed. The hip joint spaces are preserved. The femoral heads and necks and intertrochanteric regions appear normal where visualized. There is calcification within the midline in  the pelvis consistent with uterine fibroid. There are also phleboliths within the pelvis. IMPRESSION: No acute or significant chronic bony abnormality of the pelvis. Electronically Signed   By: David  Martinique M.D.   On: 12/15/2015 09:40      Assessment & Plan:  Hand-out given for education on hamstring streain and piriformis syndrome 1. Hamstring strain, right, initial encounter   2. Gluteal pain   I am not sure of pt's exact MSK injury but I suspect it is peripheral/local rather than coming from the lumbar region.  I suspect hamstring or gluteal strain vs piriformis syndrome. She is most tender at her right ischial spine in the low glut but pain radiates down posterior right thigh to below knee with decreased ROM due to pain in right knee while right hip ROM normal. Strength was normal throughout but painful only with resistance to the right hip flexors. Fortunately, a regimen of anti-inflammatories and muscle relaxant should help either  along with some gentle stretching exercises and have referred pt to [facility] who will likely be able to help further delineate etiology.  Pt is in quite severe pain today which is why she came in rather than waiting for her sports med appt in 2d so have advised her to reschedule the sports med appt to 1-2 wks from now when she can follow-up on the initial treatment. Start pred taper, followed by meloxicam qd when taper completed. Try cyclobenzaprine rather than methocarbamol unless to much daytime sedation.  Orders Placed This Encounter  Procedures  . DG Pelvis 1-2 Views    Standing Status: Future     Number of Occurrences: 1     Standing Expiration Date: 12/14/2016    Order Specific Question:  Reason for Exam (SYMPTOM  OR DIAGNOSIS REQUIRED)    Answer:  pain over right ischial tuberosity    Order Specific Question:  Preferred imaging location?    Answer:  External  . Ambulatory referral to Physical Therapy    Referral Priority:  Routine    Referral Type:   Physical Medicine    Referral Reason:  Specialty Services Required    Requested Specialty:  Physical Therapy    Number of Visits Requested:  1    Meds ordered this encounter  Medications  . predniSONE (DELTASONE) 10 MG tablet    Sig: 6-5-4-3-2-1 tabs po qd    Dispense:  21 tablet    Refill:  0  . methocarbamol (ROBAXIN) 500 MG tablet    Sig:     Refill:  0  . meloxicam (MOBIC) 15 MG tablet    Sig: Take 1 tablet (15 mg total) by mouth daily. Start AFTER prednisone    Dispense:  30 tablet    Refill:  0  . cyclobenzaprine (FLEXERIL) 5 MG tablet    Sig: Take 1 tablet (5 mg total) by mouth 3 (three) times daily as needed for muscle spasms. Take 2 tabs po qhs prn.    Dispense:  40 tablet    Refill:  1    I personally performed the services described in this documentation, which was scribed in my presence. The recorded information has been reviewed and considered, and addended by me as needed.   Delman Cheadle, M.D.  [facility] [address] [place], [place] [address] [phone] phone [phone] fax  12/15/2015 1:10 PM

## 2015-12-15 NOTE — Patient Instructions (Addendum)
IF you received an x-ray today, you will receive an invoice from [facility]. Please contact [facility] at [phone] with questions or concerns regarding your invoice.   IF you received labwork today, you will receive an invoice from [employer]. Please contact [facility] at [phone] with questions or concerns regarding your invoice.   Our billing staff will not be able to assist you with questions regarding bills from these companies.  You will be contacted with the lab results as soon as they are available. The fastest way to get your results is to activate your My Chart account. Instructions are located on the last page of this paperwork. If you have not heard from [place] regarding the results in 2 weeks, please contact this office.     Piriformis Syndrome With Rehab Piriformis syndrome is a condition the affects the nervous system in the area of the hip, and is characterized by pain and possibly a loss of feeling in the backside (posterior) thigh that may extend down the entire length of the leg. The symptoms are caused by an increase in pressure on the sciatic nerve by the piriformis muscle, which is on the back of the hip and is responsible for externally rotating the hip. The sciatic nerve and its branches connect to much of the leg. Normally the sciatic nerve runs between the piriformis muscle and other muscles. However, in certain individuals the nerve runs through the muscle, which causes an increase in pressure on the nerve and results in the symptoms of piriformis syndrome. SYMPTOMS   Pain, tingling, numbness, or burning in the back of the thigh that may also extend down the entire leg.  Occasionally, tenderness in the buttock.  Loss of function of the leg.  Pain that worsens when using the piriformis muscle (running, jumping, or stairs).  Pain that increases with prolonged sitting.  Pain that is lessened by lying flat on the  back. CAUSES   Piriformis syndrome is the result of an increase in pressure placed on the sciatic nerve. Oftentimes, piriformis syndrome is an overuse injury.  Stress placed on the nerve from a sudden increase in the intensity, frequency, or duration of training.  Compensation of other extremity injuries. RISK INCREASES WITH:  Sports that involve the piriformis muscle (running, walking, or jumping).  You are born with (congenital) a defect in which the sciatic nerve passes through the muscle. PREVENTION  Warm up and stretch properly before activity.  Allow for adequate recovery between workouts.  Maintain physical fitness:  Strength, flexibility, and endurance.  Cardiovascular fitness. PROGNOSIS  If treated properly, the symptoms of piriformis syndrome usually resolve in 2 to 6 weeks. RELATED COMPLICATIONS   Persistent and possibly permanent pain and numbness in the lower extremity.  Weakness of the extremity that may progress to disability and inability to compete. TREATMENT  The most effective treatment for piriformis syndrome is rest from any activities that aggravate the symptoms. Ice and pain medication may help reduce pain and inflammation. The use of strengthening and stretching exercises may help reduce pain with activity. These exercises may be performed at home or with a therapist. A referral to a therapist may be given for further evaluation and treatment, such as ultrasound. Corticosteroid injections may be given to reduce inflammation that is causing pressure to be placed on the sciatic nerve. If nonsurgical (conservative) treatment is unsuccessful, then surgery may be recommended.  MEDICATION   If pain medication is necessary, then nonsteroidal anti-inflammatory medications,  such as aspirin and ibuprofen, or other minor pain relievers, such as acetaminophen, are often recommended.  Do not take pain medication for 7 days before surgery.  Prescription pain relievers  may be given if deemed necessary by your caregiver. Use only as directed and only as much as you need.  Corticosteroid injections may be given by your caregiver. These injections should be reserved for the most serious cases, because they may only be given a certain number of times. HEAT AND COLD:   Cold treatment (icing) relieves pain and reduces inflammation. Cold treatment should be applied for 10 to 15 minutes every 2 to 3 hours for inflammation and pain and immediately after any activity that aggravates your symptoms. Use ice packs or massage the area with a piece of ice (ice massage).  Heat treatment may be used prior to performing the stretching and strengthening activities prescribed by your caregiver, physical therapist, or athletic trainer. Use a heat pack or soak the injury in warm water. SEEK IMMEDIATE MEDICAL CARE IF:  Treatment seems to offer no benefit, or the condition worsens.  Any medications produce adverse side effects. EXERCISES RANGE OF MOTION (ROM) AND STRETCHING EXERCISES - Piriformis Syndrome These exercises may help you when beginning to rehabilitate your injury. Your symptoms may resolve with or without further involvement from your physician, physical therapist, or athletic trainer. While completing these exercises, remember:   Restoring tissue flexibility helps normal motion to return to the joints. This allows healthier, less painful movement and activity.  An effective stretch should be held for at least 30 seconds.  A stretch should never be painful. You should only feel a gentle lengthening or release in the stretched tissue. STRETCH - Hip Rotators  Lie on your back on a firm surface. Grasp your right / left knee with your right / left hand and your ankle with your opposite hand.  Keeping your hips and shoulders firmly planted, gently pull your right / left knee and rotate your lower leg toward your opposite shoulder until you feel a stretch in your  buttocks.  Hold this stretch for __________ seconds. Repeat this stretch __________ times. Complete this stretch __________ times per day. STRETCH - Iliotibial Band  On the floor or bed, lie on your side so your right / left leg is on top. Bend your knee and grab your ankle.  Slowly bring your knee back so that your thigh is in line with your trunk. Keep your heel at your buttocks and gently arch your back so your head, shoulders, and hips line up.  Slowly lower your leg so that your knee approaches the floor/bed until you feel a gentle stretch on the outside of your right / left thigh. If you do not feel a stretch and your knee will not fall farther, place the heel of your opposite foot on top of your knee and pull your thigh down farther.  Hold this stretch for __________ seconds. Repeat __________ times. Complete __________ times per day. STRENGTHENING EXERCISES - Piriformis Syndrome  These are some of the caregiver again or until your symptoms are resolved. Remember:   Strong muscles with good endurance tolerate stress better.  Do the exercises as initially prescribed by your caregiver. Progress slowly with each exercise, gradually increasing the number of repetitions and weight used under their guidance. STRENGTH - Hip Abductors, Straight Leg Raises Be aware of your form throughout the entire exercise so that you exercise the correct muscles. Sloppy form means that you are  not strengthening the correct muscles.  Lie on your side so that your head, shoulders, knee, and hip line up. You may bend your lower knee to help maintain your balance. Your right / left leg should be on top.  Roll your hips slightly forward, so that your hips are stacked directly over each other and your right / left knee is facing forward.  Lift your top leg up 4-6 inches, leading with your heel. Be sure that your foot does not drift forward or that your knee does not roll toward the ceiling.  Hold this  position for __________ seconds. You should feel the muscles in your outer hip lifting (you may not notice this until your leg begins to tire).  Slowly lower your leg to the starting position. Allow the muscles to fully relax before beginning the next repetition. Repeat __________ times. Complete this exercise __________ times per day.  STRENGTH - Hip Abductors, Quadruped  On a firm, lightly padded surface, position yourself on your hands and knees. Your hands should be directly below your shoulders and your knees should be directly below your hips.  Keeping your right / left knee bent, lift your leg out to the side. Keep your legs level and in line with your shoulders.  Position yourself on your hands and knees.  Hold for __________ seconds.  Keeping your trunk steady and your hips level, slowly lower your leg to the starting position. Repeat __________ times. Complete this exercise __________ times per day.  STRENGTH - Hip Abductors, Standing  Tie one end of a rubber exercise band/tubing to a secure surface (table, pole) and tie a loop at the other end.  Place the loop around your right / left ankle. Keeping your ankle with the band directly opposite of the secured end, step away until there is tension in the tube/band.  Hold onto a chair as needed for balance.  Keeping your back upright, your shoulders over your hips, and your toes pointing forward, lift your right / left leg out to your side. Be sure to lift your leg with your hip muscles. Do not "throw" your leg or tip your body to lift your leg.  Slowly and with control, return to the starting position. Repeat exercise __________ times. Complete this exercise __________ times per day.    This information is not intended to replace advice given to you by your health care provider. Make sure you discuss any questions you have with your health care provider.   Document Released: 05/29/2005 Document Revised: 10/13/2014 Document  Reviewed: 09/10/2008 Elsevier Interactive Patient Education [employer].

## 2015-12-15 NOTE — Progress Notes (Deleted)
Subjective:  By signing my name below, I, [name], attest that this documentation has been prepared under the direction and in the presence of Delman Cheadle, MD. Electronically Signed: [name], [place]. 12/15/2015 , 9:31 AM .  Patient was seen in Room 8 .   Patient ID: Lynn Gardner, female    DOB: [birth year], 65 y.o.   MRN: [id number] Chief Complaint  Patient presents with   Back Pain    x 2 wks/ right side pain going leg. pt taking muscle relaxer but unsure of the name    HPI Lynn Gardner is a 65 y.o. female who presents to [facility] complaining of hip pain radiating down right upper leg (over her gluteus and hamstring) that started 2 weeks ago. She believed she pulled a muscle when she pushed a box with her right foot. She states that it bothers her constantly and unable to find a comfortable position (hurts to sit, lay down, occasional spasms when standing). She denies any previous injuries to the area.   Patient had informed her PCP about 10 days ago and had Methocarbamol 500mg  called in. She tried taking aleve first week but didn't take any after the muscle relaxer. She's also been applying a heat pad and ice pack over the area with relief from heat pad. She denies pain in her lower back. She denies any changes in urination or bowels, weakness, numbness or foot pain.   She usually exercises with running but hasn't in about 6 months.   Past Medical History  Diagnosis Date   Essential hypertension, malignant    Pure hypercholesterolemia    Allergic rhinitis due to pollen    Extrinsic asthma, unspecified    Hypopotassemia    Hyperosmolality and/or hypernatremia    Palpitations    Fibroids    H/O osteopenia    Vitamin D deficiency    Allergy    Prior to Admission medications   Medication Sig Start Date End Date Taking? Authorizing Provider  amLODipine (NORVASC) 5 MG tablet Take 5 mg by mouth daily. 1/2 tab    Yes Historical Provider, MD  azelastine  (ASTELIN) 137 MCG/SPRAY nasal spray 1 spray by Nasal route daily. Use in each nostril as directed    Yes Historical Provider, MD  cholecalciferol (VITAMIN D) 1000 UNITS tablet Take 1,000 Units by mouth daily.   Yes Historical Provider, MD  fexofenadine (ALLEGRA) 60 MG tablet Take 60 mg by mouth 2 (two) times daily.   Yes Historical Provider, MD  mometasone (NASONEX) 50 MCG/ACT nasal spray 2 sprays by Nasal route daily.     Yes Historical Provider, MD  Potassium Chloride (KLOR-CON PO) Take 20 mg by mouth daily.     Yes Historical Provider, MD  potassium chloride SA (K-DUR,KLOR-CON) 20 MEQ tablet  02/02/15  Yes Historical Provider, MD  RECTICARE 5 % CREA  04/01/15  Yes Historical Provider, MD  TRIAMTERENE-HCTZ PO Take 25 mg by mouth daily.    Yes Historical Provider, MD  triamterene-hydrochlorothiazide (MAXZIDE-25) 37.5-25 MG tablet  03/08/15  Yes Historical Provider, MD  montelukast (SINGULAIR) 10 MG tablet Take 10 mg by mouth daily. Reported on 12/15/2015    Historical Provider, MD   Allergies  Allergen Reactions   Nisoldipine Er     Headaches   Penicillins    Tessalon Perles     Review of Systems  Constitutional: Positive for activity change. Negative for fever, chills and fatigue.  Cardiovascular: Negative for leg swelling.  Gastrointestinal: Negative for nausea,  vomiting, abdominal pain, diarrhea and constipation.  Genitourinary: Negative for dysuria, urgency, flank pain, enuresis and difficulty urinating.  Musculoskeletal: Positive for myalgias. Negative for back pain, joint swelling, arthralgias and gait problem.  Skin: Negative for color change, rash and wound.  Neurological: Negative for weakness and numbness.  Psychiatric/Behavioral: Positive for sleep disturbance.       Objective:   Physical Exam  Constitutional: She is oriented to person, place, and time. She appears well-developed and well-nourished. No distress.  HENT:  Head: Normocephalic and atraumatic.  Eyes: EOM are  normal. Pupils are equal, round, and reactive to light.  Neck: Neck supple.  Cardiovascular: Normal rate.   Pulmonary/Chest: Effort normal. No respiratory distress.  Musculoskeletal: Normal range of motion.  No tenderness over the lumbar spine or the paraspinal muscles, forward flexion limited to approximately 30 degrees, moderate restriction in extension, negative straight leg raise bilaterally, severe pain in hamstring with any movement of either extremity, no significant tenderness over SI joint or the greater trochanter, most tender over the ischial spine, bilateral lower extremity strength 5/5, pain with use of right hip flexors but strength 5/5 no pain with quad and hamstring testings, full hip rom bilaterally  Neurological: She is alert and oriented to person, place, and time.  Reflex Scores:      Patellar reflexes are 2+ on the right side and 2+ on the left side.      Achilles reflexes are 2+ on the right side and 2+ on the left side. Skin: Skin is warm and dry.  Psychiatric: She has a normal mood and affect. Her behavior is normal.  Nursing note and vitals reviewed.   BP 146/78 mmHg   Pulse 74   Temp(Src) 97.4 F (36.3 C) (Oral)   Resp 18   Ht 5\' 3"  (1.6 m)   Wt 148 lb 12.8 oz (67.495 kg)   BMI 26.37 kg/m2   SpO2 100%   Dg Pelvis 1-2 Views  12/15/2015  CLINICAL DATA:  Hip pain radiating into the hamstring region which began 2 weeks ago possibly related to a muscle strain ; difficult to find a comfortable position. EXAM: PELVIS - 1-2 VIEW COMPARISON:  None in PACs FINDINGS: The bony pelvis is subjectively adequately mineralized for age. There is no acute or healing fracture. No avulsion from the inferior pubic rami is observed. The hip joint spaces are preserved. The femoral heads and necks and intertrochanteric regions appear normal where visualized. There is calcification within the midline in the pelvis consistent with uterine fibroid. There are also phleboliths within the pelvis.  IMPRESSION: No acute or significant chronic bony abnormality of the pelvis. Electronically Signed   By: David  Martinique M.D.   On: 12/15/2015 09:40      Assessment & Plan:  Hand-out given for education on hamstring streain and piriformis syndrome 1. Hamstring strain, right, initial encounter   2. Gluteal pain   I am not sure of pt's exact MSK injury but I suspect it is peripheral/local rather than coming from the lumbar region.  I suspect hamstring or gluteal strain vs piriformis syndrome. She is most tender at her right ischial spine in the low glut but pain radiates down posterior right thigh to below knee with decreased ROM due to pain in right knee while right hip ROM normal. Strength was normal throughout but painful only with resistance to the right hip flexors. Fortunately, a regimen of anti-inflammatories and muscle relaxant should help either along with some gentle stretching exercises and have  referred pt to [facility] who will likely be able to help further delineate etiology.  Pt is in quite severe pain today which is why she came in rather than waiting for her sports med appt in 2d so have advised her to reschedule the sports med appt to 1-2 wks from now when she can follow-up on the initial treatment. Start pred taper, followed by meloxicam qd when taper completed. Try cyclobenzaprine rather than methocarbamol unless to much daytime sedation.  Orders Placed This Encounter  Procedures   DG Pelvis 1-2 Views    Standing Status: Future     Number of Occurrences: 1     Standing Expiration Date: 12/14/2016    Order Specific Question:  Reason for Exam (SYMPTOM  OR DIAGNOSIS REQUIRED)    Answer:  pain over right ischial tuberosity    Order Specific Question:  Preferred imaging location?    Answer:  External   Ambulatory referral to Physical Therapy    Referral Priority:  Routine    Referral Type:  Physical Medicine    Referral Reason:  Specialty Services Required    Requested Specialty:   Physical Therapy    Number of Visits Requested:  1    Meds ordered this encounter  Medications   predniSONE (DELTASONE) 10 MG tablet    Sig: 6-5-4-3-2-1 tabs po qd    Dispense:  21 tablet    Refill:  0   methocarbamol (ROBAXIN) 500 MG tablet    Sig:     Refill:  0   meloxicam (MOBIC) 15 MG tablet    Sig: Take 1 tablet (15 mg total) by mouth daily. Start AFTER prednisone    Dispense:  30 tablet    Refill:  0   cyclobenzaprine (FLEXERIL) 5 MG tablet    Sig: Take 1 tablet (5 mg total) by mouth 3 (three) times daily as needed for muscle spasms. Take 2 tabs po qhs prn.    Dispense:  40 tablet    Refill:  1    I personally performed the services described in this documentation, which was scribed in my presence. The recorded information has been reviewed and considered, and addended by me as needed.   Delman Cheadle, M.D.  [facility] [address] [place], [place] [address] [phone] phone [phone] fax  12/15/2015 1:01 PM

## 2015-12-17 ENCOUNTER — Ambulatory Visit: Payer: 59 | Admitting: Family Medicine

## 2015-12-22 ENCOUNTER — Ambulatory Visit: Payer: PRIVATE HEALTH INSURANCE | Attending: Family Medicine | Admitting: Physical Therapy

## 2015-12-22 DIAGNOSIS — R2689 Other abnormalities of gait and mobility: Secondary | ICD-10-CM | POA: Diagnosis present

## 2015-12-22 DIAGNOSIS — M79651 Pain in right thigh: Secondary | ICD-10-CM | POA: Insufficient documentation

## 2015-12-22 NOTE — Therapy (Signed)
[place], [place], [address] Phone: [phone]   Fax:  [phone]  Physical Therapy Evaluation  Patient Details  Name: Lynn Gardner MRN: [id number] Date of Birth: 02/07/1951 Referring Provider: Delman Cheadle MD  Encounter Date: 12/22/2015      PT End of Session - 12/22/15 1644    Visit Number 1   Number of Visits 7   Date for PT Re-Evaluation 02/02/16   Authorization Type [facility] U[facility]   PT Start Time 1500   PT Stop Time 1545   PT Time Calculation (min) 45 min   Activity Tolerance Patient tolerated treatment well   Behavior During Therapy [facility] for tasks assessed/performed      Past Medical History  Diagnosis Date  . Essential hypertension, malignant   . Pure hypercholesterolemia   . Allergic rhinitis due to pollen   . Extrinsic asthma, unspecified   . Hypopotassemia   . Hyperosmolality and/or hypernatremia   . Palpitations   . Fibroids   . H/O osteopenia   . Vitamin D deficiency   . Allergy     Past Surgical History  Procedure Laterality Date  . Appendectomy  1972  . Wisdom tooth extraction    . Tonsillectomy      There were no vitals filed for this visit.       Subjective Assessment - 12/22/15 1503    Subjective pt is a 65 y.o  F with CC of R posterior hip/ thigh pain that occured on 11/25/2015  she was moving boxes because her office was moving and during the move she slide her R leg out  and felt a pain that occured in the hip that seemd to worsen. Since it started it was really worse so she went ot se her MD and put her on medication.    Limitations Sitting;Lifting;Standing;Walking;House hold activities   How long can you sit comfortably? 5-6 min   How long can you stand comfortably? unlimited   How long can you walk comfortably? 30 - 45 min  getting better   Diagnostic tests x-ray at family medicine    Patient Stated Goals to stop the pain, to sit longer comfortably, get back to  exercise    Currently in Pain? Yes   Pain Score 3    Pain Location Leg   Pain Orientation Right;Upper;Posterior   Pain Descriptors / Indicators Aching;Spasm;Tightness   Pain Type Chronic pain   Pain Onset More than a month ago   Pain Frequency Intermittent   Aggravating Factors  twisting, prlonged sitting, reaching, getting up after laying down   Pain Relieving Factors laying down on heating pad,             [facility] PT Assessment - 12/22/15 1455    Assessment   Medical Diagnosis R hamstring Strain   Referring Provider Delman Cheadle MD   Onset Date/Surgical Date 11/25/15   Hand Dominance Right   Next MD Visit doesn't see's dr Sherian Rein following PT   Prior Therapy yes   Precautions   Precautions None   Restrictions   Weight Bearing Restrictions No   Balance Screen   Has the patient fallen in the past 6 months No   Has the patient had a decrease in activity level because of a fear of falling?  No   Is the patient reluctant to leave their home because of a fear of falling?  No   Home Ecologist residence  Living Arrangements Spouse/significant other   Available Help at Discharge Available PRN/intermittently   Type of [place] to enter   Entrance Stairs-Number of Steps 5   Entrance Stairs-Rails Right   Home Layout One level   [place] - quad   Prior Function   Level of Independence Independent;Independent with basic ADLs   Vocation Full time employment  IT department   Vocation Requirements prolonged sitting, inspecting equipment, moving boxes, bending/ squatting, walking, moving    Leisure running, exercise, gym, playing with grand childs, cooking   Cognition   Overall Cognitive Status Within Functional Limits for tasks assessed   Observation/Other Assessments   Lower Extremity Functional Scale  37/80   Posture/Postural Control   Posture/Postural Control Postural limitations   Postural Limitations Forward  head;Rounded Shoulders   ROM / Strength   AROM / PROM / Strength AROM;Strength;PROM   AROM   Overall AROM Comments hip WNL   AROM Assessment Site Knee;Hip   Right/Left Hip Right;Left   Right/Left Knee Right;Left   Right Knee Extension 0   Right Knee Flexion 130   Left Knee Extension -9   Left Knee Flexion 130   PROM   PROM Assessment Site Hip;Knee   Right/Left Hip Right   Right/Left Knee Right   Right Knee Extension -5  erp in proximal hip   Strength   Strength Assessment Site Hip;Knee   Right/Left Hip Right;Left   Right Hip Flexion 4/5   Right Hip Extension 4-/5   Right Hip ABduction 4-/5   Right Hip ADduction 4/5   Left Hip Flexion 4/5   Left Hip ABduction 4-/5   Left Hip ADduction 4/5   Right/Left Knee Right;Left   Right Knee Flexion 4+/5   Right Knee Extension 4+/5  pain getting into position   Left Knee Flexion 4+/5   Left Knee Extension 4+/5   Flexibility   Soft Tissue Assessment /Muscle Length yes   Hamstrings R: 40, L: hip 60 degrees  pain near the ischial tuberosity on the R   Palpation   Palpation comment mulitple palpable knots in the muscle belly semi-membransous/ pain at the musculotendinous junciton near the R ischial tuberosity   Ambulation/Gait   Ambulation/Gait Yes   Gait Pattern Step-through pattern;Antalgic;Decreased step length - right                   [facility] Adult PT Treatment/Exercise - 12/22/15 1455    Iontophoresis   Type of Iontophoresis Dexamethasone   Location R ischial tuberosity   Dose 1 cc   Time 6 hour stat patch                PT Education - 12/22/15 1642    Education provided Yes   Education Details evaluation findings, POC, goals, HEP with proper form / treatment rationale,    Person(s) Educated Patient   Methods Explanation;Demonstration;Handout;Verbal cues   Comprehension Verbalized understanding;Verbal cues required          PT Short Term Goals - 12/22/15 1706    PT SHORT TERM GOAL #1   Title pt  will be I with inital HEP as of last visit (01/12/2016)   Time 3   Period Weeks   Status New   PT SHORT TERM GOAL #2   Title pt will be able to verbalize / demo proper techniques to reduce R posterior thigh pain via RICE and HEP (01/12/2016)   Time 3   Period Weeks  Status New           PT Long Term Goals - 12/22/15 1707    PT LONG TERM GOAL #1   Title pt will be I with all HEP given as of last visit (02/02/2016)   Time 6   Period Weeks   Status New   PT LONG TERM GOAL #2   Title pt will improve her SLR on the R by >/= 10 degrees with </= 2/10 pain to demonstrate decreased hamstring tighntess (02/02/2016)   Time 6   Period Weeks   Status New   PT LONG TERM GOAL #3   Title pt will be able to sit for >/= 45 minutes with </= 2/10 pain to assist with work related requirements (02/02/2016)   Time 6   Period Weeks   Status New   PT LONG TERM GOAL #4   Title pt will be able  jog for >/= 15 minutes with </= 2/10 pain to assist with personal goal of getting back into running and exercise (02/02/2016)   Time 6   Period Weeks   Status New   PT LONG TERM GOAL #5   Title pt will improve her LEFS score to >/= 60 points to demonstrate improvement in function at discharge (02/02/2016)   Time 6   Status New               Plan - 12/22/15 1645    Clinical Impression Statement Mrs. Buchbinder presents to O[facility] as a low complexity evaluation with CC of R posterior hip/thigh pain that started on 11/25/2015. pt demonstrates limited  R knee extension secondary to pain. soft tissue restrcion on the R compared bil with SLR limited by pain. Palpation revealed tenderness with multiple knots inthe semi-mebranosus and significant sorness at the musculotendinous juntion located near the ischial tuberosity on the R. pt would benefit from physicla therapyt to decrase pain, decrease muscle tightness, and return pt to PLOF by addressing the impairments listed.    Rehab Potential Good   PT Frequency 1x / week    PT Duration 6 weeks   PT Treatment/Interventions ADLs/Self Care Home Management;Cryotherapy;Electrical Stimulation;Iontophoresis 4mg /ml Dexamethasone;Moist Heat;Therapeutic exercise;Therapeutic activities;Dry needling;Passive range of motion;Manual techniques;Ultrasound;Taping   PT Next Visit Plan assess/ review HEP, assess response to iontophoresis, IASTM/ stm along hamstrings, stretching,    PT Home Exercise Plan hamstring stretching (3 variations), single knee to chest,    Consulted and Agree with Plan of Care Patient      Patient will benefit from skilled therapeutic intervention in order to improve the following deficits and impairments:  Pain, Improper body mechanics, Postural dysfunction, Decreased endurance, Decreased activity tolerance, Decreased balance, Hypomobility, Increased fascial restricitons  Visit Diagnosis: Pain in right thigh - Plan: PT plan of care cert/re-cert  Other abnormalities of gait and mobility - Plan: PT plan of care cert/re-cert     Problem List Patient Active Problem List   Diagnosis Date Noted  . Right anterior knee pain 04/28/2015   Starr Lake PT, DPT, LAT, ATC  12/22/2015  5:26 PM      [facility] [address] [place], [place], [address] Phone: [phone]   Fax:  [phone]  Name: MALITA FUNES MRN: [id number] Date of Birth: 1950/12/12

## 2015-12-29 ENCOUNTER — Ambulatory Visit: Payer: PRIVATE HEALTH INSURANCE | Attending: Internal Medicine | Admitting: Physical Therapy

## 2015-12-29 DIAGNOSIS — R2689 Other abnormalities of gait and mobility: Secondary | ICD-10-CM | POA: Insufficient documentation

## 2015-12-29 DIAGNOSIS — M79651 Pain in right thigh: Secondary | ICD-10-CM | POA: Diagnosis not present

## 2015-12-29 NOTE — Patient Instructions (Signed)

## 2015-12-29 NOTE — Therapy (Signed)
[place], [place], [address] Phone: [phone]   Fax:  [phone]  Physical Therapy Treatment  Patient Details  Name: Lynn Gardner MRN: [id number] Date of Birth: 1951-04-06 Referring Provider: Delman Cheadle MD  Encounter Date: 12/29/2015      PT End of Session - 12/29/15 0847    Visit Number 2   Number of Visits 7   Date for PT Re-Evaluation 02/02/16   Authorization Type [facility] UMR   PT Start Time 0800   PT Stop Time 0845   PT Time Calculation (min) 45 min   Activity Tolerance Patient tolerated treatment well   Behavior During Therapy [facility] for tasks assessed/performed      Past Medical History  Diagnosis Date  . Essential hypertension, malignant   . Pure hypercholesterolemia   . Allergic rhinitis due to pollen   . Extrinsic asthma, unspecified   . Hypopotassemia   . Hyperosmolality and/or hypernatremia   . Palpitations   . Fibroids   . H/O osteopenia   . Vitamin D deficiency   . Allergy     Past Surgical History  Procedure Laterality Date  . Appendectomy  1972  . Wisdom tooth extraction    . Tonsillectomy      There were no vitals filed for this visit.      Subjective Assessment - 12/29/15 0757    Subjective "I am a litlte sore especially with sleeping, and getting up from a seated position but I does feel like it is getting  a little better"   Currently in Pain? Yes   Pain Score 4    Pain Orientation Right;Upper;Posterior   Pain Type Chronic pain   Pain Onset More than a month ago   Pain Frequency Intermittent   Aggravating Factors  prolonged standing/ sitting and getting up from a seated position   Pain Relieving Factors laying down on heating pad and stretch                         [facility] Adult PT Treatment/Exercise - 12/29/15 0805    Knee/Hip Exercises: Stretches   Active Hamstring Stretch 3 reps;30 seconds  gentle contract/ relax with 10 sec hold   Knee/Hip Exercises: Aerobic   Nustep L5 x 5 min  LE only   Modalities   Modalities Ultrasound   Ultrasound   Ultrasound Location R proximal hamstring tendon   Ultrasound Parameters 1.8 w/cm2 , 100% x 8 min   Ultrasound Goals Pain   Iontophoresis   Type of Iontophoresis Dexamethasone   Location R ischial tuberosity   Dose 1 cc   Time 6 hour stat patch   Manual Therapy   Manual Therapy Soft tissue mobilization;Myofascial release   Soft tissue mobilization IASTM of R mid hamstring    Myofascial Release fascial stretching/ rolling of the R mid hamstring          Trigger Point Dry Needling - 12/29/15 0846    Consent Given? Yes   Education Handout Provided Yes   Muscles Treated Lower Body Hamstring   Hamstring Response Twitch response elicited;Palpable increased muscle length  x 2 along semi-membranousus with pistoning              PT Education - 12/29/15 0846    Education provided Yes   Education Details dry needling benefits, what to expect and after care.    Person(s) Educated Patient   Methods Explanation;Demonstration;Handout   Comprehension Verbalized  understanding          PT Short Term Goals - 12/22/15 1706    PT SHORT TERM GOAL #1   Title pt will be I with inital HEP as of last visit (01/12/2016)   Time 3   Period Weeks   Status New   PT SHORT TERM GOAL #2   Title pt will be able to verbalize / demo proper techniques to reduce R posterior thigh pain via RICE and HEP (01/12/2016)   Time 3   Period Weeks   Status New           PT Long Term Goals - 12/22/15 1707    PT LONG TERM GOAL #1   Title pt will be I with all HEP given as of last visit (02/02/2016)   Time 6   Period Weeks   Status New   PT LONG TERM GOAL #2   Title pt will improve her SLR on the R by >/= 10 degrees with </= 2/10 pain to demonstrate decreased hamstring tighntess (02/02/2016)   Time 6   Period Weeks   Status New   PT LONG TERM GOAL #3   Title pt will be able to sit for >/=  45 minutes with </= 2/10 pain to assist with work related requirements (02/02/2016)   Time 6   Period Weeks   Status New   PT LONG TERM GOAL #4   Title pt will be able  jog for >/= 15 minutes with </= 2/10 pain to assist with personal goal of getting back into running and exercise (02/02/2016)   Time 6   Period Weeks   Status New   PT LONG TERM GOAL #5   Title pt will improve her LEFS score to >/= 60 points to demonstrate improvement in function at discharge (02/02/2016)   Time 6   Status New               Plan - 12/29/15 0847    Clinical Impression Statement Lynn Gardner rpeorts she is getting better but continues to have soress with sit>Standing. she provided consent for DN of the R hamstring x 2; pt monitored throughout treatment. Following IASTM and mysofascila techniques and [place] at the proximal Hamstring she reported decreased pain and was able to wlak wihtout a limp.    PT Next Visit Plan assess response to DN, assess response to iontophoresis, IASTM/ stm along hamstrings, stretching, hip strengthening   Consulted and Agree with Plan of Care Patient      Patient will benefit from skilled therapeutic intervention in order to improve the following deficits and impairments:  Pain, Improper body mechanics, Postural dysfunction, Decreased endurance, Decreased activity tolerance, Decreased balance, Hypomobility, Increased fascial restricitons  Visit Diagnosis: Pain in right thigh  Other abnormalities of gait and mobility     Problem List Patient Active Problem List   Diagnosis Date Noted  . Right anterior knee pain 04/28/2015   Starr Lake PT, DPT, LAT, ATC  12/29/2015  8:56 AM      [facility] [address] [place], [place], [address] Phone: [phone]   Fax:  [phone]  Name: Lynn Gardner MRN: [id number] Date of Birth: 04/21/1951

## 2016-01-05 ENCOUNTER — Ambulatory Visit: Payer: PRIVATE HEALTH INSURANCE | Admitting: Physical Therapy

## 2016-01-05 DIAGNOSIS — M79651 Pain in right thigh: Secondary | ICD-10-CM

## 2016-01-05 DIAGNOSIS — R2689 Other abnormalities of gait and mobility: Secondary | ICD-10-CM | POA: Diagnosis not present

## 2016-01-05 NOTE — Therapy (Signed)
[place], [place], [address] Phone: [phone]   Fax:  [phone]  Physical Therapy Treatment  Patient Details  Name: Lynn Gardner MRN: [id number] Date of Birth: 06-20-1950 Referring Provider: Delman Cheadle MD  Encounter Date: 01/05/2016    Past Medical History:  Diagnosis Date  . Allergic rhinitis due to pollen   . Allergy   . Essential hypertension, malignant   . Extrinsic asthma, unspecified   . Fibroids   . H/O osteopenia   . Hyperosmolality and/or hypernatremia   . Hypopotassemia   . Palpitations   . Pure hypercholesterolemia   . Vitamin D deficiency     Past Surgical History:  Procedure Laterality Date  . APPENDECTOMY  1972  . TONSILLECTOMY    . WISDOM TOOTH EXTRACTION      There were no vitals filed for this visit.      Subjective Assessment - 01/05/16 1630    Subjective pain has moved to rear.  pain increasing. to 6.5/10,  HAS BEEN ICING AND stretching.   No sure why pain is getting worse.   Pain has been worse since Sat.     Pain Score 6   6.5   Pain Location Leg   Pain Orientation Right;Upper;Posterior   Pain Descriptors / Indicators Aching;Spasm   Pain Frequency Intermittent   Aggravating Factors  walking,  getting uo from lying, sitting,  getting in and out of car   Pain Relieving Factors ice, heat,  stretching                         [facility] Adult PT Treatment/Exercise - 01/05/16 0001      Self-Care   Self-Care Other Self-Care Comments   Other Self-Care Comments  Educated about precautions, pain control, referred her to her MD for medication advice.      Knee/Hip Exercises: Stretches   Passive Hamstring Stretch 3 reps;30 seconds   Piriformis Stretch 1 rep;30 seconds   Gastroc Stretch 1 rep;10 seconds  manually   Gastroc Stretch Limitations she is already doing at home.    Other Knee/Hip Stretches knee to chest 3 X 30     Ultrasound   Ultrasound  Location Right proximal hamstrings   Ultrasound Parameters 1.8 watts/cm2,  100%, 8 minutes   Ultrasound Goals Pain     Iontophoresis   Type of Iontophoresis Dexamethasone   Location proximal hamstring   Dose 1cc, 4 mg/ml   Time 6 hour patch/ 5 minutes     Manual Therapy   Manual Therapy Soft tissue mobilization   Manual therapy comments retrograde soft tissueposterior leg/ gluteal. concentration on gluteal/proximal hamstrings  tissue softened,  edema reduced.                PT Education - 01/05/16 1740    Education provided Yes   Education Details Self care   Person(s) Educated Patient   Methods Explanation   Comprehension Verbalized understanding          PT Short Term Goals - 01/05/16 1743      PT SHORT TERM GOAL #1   Title pt will be I with inital HEP as of last visit (01/12/2016)   Baseline Patient able to demonstrate her entire home exercise so far and use correct technique. (01/05/2016)   Time 3   Period Weeks   Status Achieved     PT SHORT TERM GOAL #2   Title pt will be able  to verbalize / demo proper techniques to reduce R posterior thigh pain via RICE and HEP (01/12/2016)   Baseline education continues with this.   Time 3   Period Weeks   Status On-going           PT Long Term Goals - 12/22/15 1707      PT LONG TERM GOAL #1   Title pt will be I with all HEP given as of last visit (02/02/2016)   Time 6   Period Weeks   Status New     PT LONG TERM GOAL #2   Title pt will improve her SLR on the R by >/= 10 degrees with </= 2/10 pain to demonstrate decreased hamstring tighntess (02/02/2016)   Time 6   Period Weeks   Status New     PT LONG TERM GOAL #3   Title pt will be able to sit for >/= 45 minutes with </= 2/10 pain to assist with work related requirements (02/02/2016)   Time 6   Period Weeks   Status New     PT LONG TERM GOAL #4   Title pt will be able  jog for >/= 15 minutes with </= 2/10 pain to assist with personal goal of getting  back into running and exercise (02/02/2016)   Time 6   Period Weeks   Status New     PT LONG TERM GOAL #5   Title pt will improve her LEFS score to >/= 60 points to demonstrate improvement in function at discharge (02/02/2016)   Time 6   Status New               Plan - 01/05/16 1740    Clinical Impression Statement Hamstring length 60 degrees.  Pain increased at beginning of session with less pain at it's end.  ionto has not helped yet.  STG#1 met.   PT Next Visit Plan Manual, stretching, [place] , consider taping   PT Home Exercise Plan continue   Consulted and Agree with Plan of Care Patient      Patient will benefit from skilled therapeutic intervention in order to improve the following deficits and impairments:  Pain, Improper body mechanics, Postural dysfunction, Decreased endurance, Decreased activity tolerance, Decreased balance, Hypomobility, Increased fascial restricitons  Visit Diagnosis: Pain in right thigh  Other abnormalities of gait and mobility     Problem List Patient Active Problem List   Diagnosis Date Noted  . Right anterior knee pain 04/28/2015    [facility] 01/05/2016, 5:47 PM  [place], [place], [address] Phone: [phone]   Fax:  [phone]  Name: RIAN KOON MRN: [id number] Date of Birth: [birth year]   Melvenia Needles, PTA 01/05/16 5:47 PM Phone: [phone] Fax: [phone]

## 2016-01-07 MED FILL — CYCLOBENZAPRINE 5 MG TABLET: 5 | 8 days supply | Qty: 40 | Fill #1

## 2016-01-11 ENCOUNTER — Ambulatory Visit: Payer: PRIVATE HEALTH INSURANCE | Attending: Internal Medicine | Admitting: Physical Therapy

## 2016-01-11 DIAGNOSIS — R2689 Other abnormalities of gait and mobility: Secondary | ICD-10-CM | POA: Insufficient documentation

## 2016-01-11 DIAGNOSIS — M79651 Pain in right thigh: Secondary | ICD-10-CM | POA: Diagnosis not present

## 2016-01-11 NOTE — Therapy (Signed)
[place]ynn Gardner, [place], [address] Phone: [phone]   Fax:  [phone]  Physical Therapy Treatment  Patient Details  Name: Lynn Gardner MRN: [id number] Date of Birth: [birth year] Referring Provider: Delman Cheadle MD  Encounter Date: 01/11/2016      PT End of Session - 01/11/16 1725    Visit Number 3   Number of Visits 7   Date for PT Re-Evaluation 02/02/16   Activity Tolerance Patient tolerated treatment well      Past Medical History:  Diagnosis Date  . Allergic rhinitis due to pollen   . Allergy   . Essential hypertension, malignant   . Extrinsic asthma, unspecified   . Fibroids   . H/O osteopenia   . Hyperosmolality and/or hypernatremia   . Hypopotassemia   . Palpitations   . Pure hypercholesterolemia   . Vitamin D deficiency     Past Surgical History:  Procedure Laterality Date  . APPENDECTOMY  1972  . TONSILLECTOMY    . WISDOM TOOTH EXTRACTION      There were no vitals filed for this visit.      Subjective Assessment - 01/11/16 1628    Subjective "I am little more sore today, and over the weekend I rode to [place] and i got some spasm so I took meds for spasm"   Currently in Pain? Yes   Pain Score 5    Pain Location Leg   Pain Orientation Right;Upper;Posterior   Pain Descriptors / Indicators Aching   Pain Type Chronic pain   Pain Onset More than a month ago   Pain Frequency Intermittent   Aggravating Factors  prolonged sitting, sleeping,    Pain Relieving Factors walking,                          [facility] Adult PT Treatment/Exercise - 01/11/16 0001      Knee/Hip Exercises: Stretches   Active Hamstring Stretch Right;5 reps;30 seconds  contract/ relax with 10 sec hold     Knee/Hip Exercises: Aerobic   Nustep L5 x 5 min  LE only     Ultrasound   Ultrasound Location R proximal hamstring    Ultrasound Parameters 100%, 1.2 w/cm2 x 8 min   Ultrasound Goals  Pain     Manual Therapy   Soft tissue mobilization IASTM of R mid hamstring    Myofascial Release fascial stretching/ rolling of the R mid hamstring          Trigger Point Dry Needling - 01/11/16 1732    Consent Given? Yes   Education Handout Provided No   Muscles Treated Lower Body Hamstring   Hamstring Response Twitch response elicited;Palpable increased muscle length  x 3 with pistoning/ twitching                PT Short Term Goals - 01/05/16 1743      PT SHORT TERM GOAL #1   Title pt will be I with inital HEP as of last visit (01/12/2016)   Baseline Patient able to demonstrate her entire home exercise so far and use correct technique. (01/05/2016)   Time 3   Period Weeks   Status Achieved     PT SHORT TERM GOAL #2   Title pt will be able to verbalize / demo proper techniques to reduce R posterior thigh pain via RICE and HEP (01/12/2016)   Baseline education continues with this.   Time 3  Period Weeks   Status On-going           PT Long Term Goals - 12/22/15 1707      PT LONG TERM GOAL #1   Title pt will be I with all HEP given as of last visit (02/02/2016)   Time 6   Period Weeks   Status New     PT LONG TERM GOAL #2   Title pt will improve her SLR on the R by >/= 10 degrees with </= 2/10 pain to demonstrate decreased hamstring tighntess (02/02/2016)   Time 6   Period Weeks   Status New     PT LONG TERM GOAL #3   Title pt will be able to sit for >/= 45 minutes with </= 2/10 pain to assist with work related requirements (02/02/2016)   Time 6   Period Weeks   Status New     PT LONG TERM GOAL #4   Title pt will be able  jog for >/= 15 minutes with </= 2/10 pain to assist with personal goal of getting back into running and exercise (02/02/2016)   Time 6   Period Weeks   Status New     PT LONG TERM GOAL #5   Title pt will improve her LEFS score to >/= 60 points to demonstrate improvement in function at discharge (02/02/2016)   Time 6   Status New                Plan - 01/11/16 1736    Clinical Impression Statement [name] reports pain at a 5/10 initally today. DN was performed x 3 in her R hamstring; pt was monitored throught treatment. following soft tissue work and [place] she reported no pain. plan to continue with current POC.    PT Next Visit Plan Manual, stretching, [place] , consider taping, contract relax stretching DN on hamstrings, assess goals.    Consulted and Agree with Plan of Care Patient      Patient will benefit from skilled therapeutic intervention in order to improve the following deficits and impairments:  Pain, Improper body mechanics, Postural dysfunction, Decreased endurance, Decreased activity tolerance, Decreased balance, Hypomobility, Increased fascial restricitons  Visit Diagnosis: Pain in right thigh  Other abnormalities of gait and mobility     Problem List Patient Active Problem List   Diagnosis Date Noted  . Right anterior knee pain 04/28/2015  . Starr Lake PT, DPT, LAT, ATC  01/11/16  5:40 PM      [facility] [address] [place], [place], [address] Phone: [phone]   Fax:  [phone]  Name: Lynn Gardner MRN: [id number] Date of Birth: 09-29-1950

## 2016-01-18 ENCOUNTER — Ambulatory Visit: Payer: PRIVATE HEALTH INSURANCE | Admitting: Physical Therapy

## 2016-01-18 DIAGNOSIS — I10 Essential (primary) hypertension: Secondary | ICD-10-CM | POA: Diagnosis not present

## 2016-01-18 DIAGNOSIS — M79651 Pain in right thigh: Secondary | ICD-10-CM

## 2016-01-18 DIAGNOSIS — J309 Allergic rhinitis, unspecified: Secondary | ICD-10-CM | POA: Diagnosis not present

## 2016-01-18 DIAGNOSIS — R2689 Other abnormalities of gait and mobility: Secondary | ICD-10-CM | POA: Diagnosis not present

## 2016-01-18 DIAGNOSIS — G5701 Lesion of sciatic nerve, right lower limb: Secondary | ICD-10-CM | POA: Diagnosis not present

## 2016-01-18 DIAGNOSIS — M47816 Spondylosis without myelopathy or radiculopathy, lumbar region: Secondary | ICD-10-CM | POA: Diagnosis not present

## 2016-01-18 NOTE — Therapy (Signed)
[place], [place], [address] Phone: [phone]   Fax:  [phone]  Physical Therapy Treatment  Patient Details  Name: Lynn Gardner MRN: [id number] Date of Birth: May 07, 1951 Referring Provider: Delman Cheadle MD  Encounter Date: 01/18/2016      PT End of Session - 01/18/16 1734    Visit Number 5   Number of Visits 7   Date for PT Re-Evaluation 02/02/16   PT Start Time 1630   PT Stop Time 1716   PT Time Calculation (min) 46 min   Activity Tolerance Patient tolerated treatment well   Behavior During Therapy [facility] for tasks assessed/performed      Past Medical History:  Diagnosis Date  . Allergic rhinitis due to pollen   . Allergy   . Essential hypertension, malignant   . Extrinsic asthma, unspecified   . Fibroids   . H/O osteopenia   . Hyperosmolality and/or hypernatremia   . Hypopotassemia   . Palpitations   . Pure hypercholesterolemia   . Vitamin D deficiency     Past Surgical History:  Procedure Laterality Date  . APPENDECTOMY  1972  . TONSILLECTOMY    . WISDOM TOOTH EXTRACTION      There were no vitals filed for this visit.      Subjective Assessment - 01/18/16 1631    Subjective "I felt good after last session, I have some spasming in my bottom, and when I sneeze I have to grab my leg to keep to keep it form hurting"    Currently in Pain? Yes   Pain Score 5    Pain Orientation Right   Pain Descriptors / Indicators Aching   Pain Type Chronic pain   Pain Onset More than a month ago   Pain Frequency Intermittent   Aggravating Factors  prolonged sitting, sleeping   Pain Relieving Factors walking, standing stretching,             [facility] PT Assessment - 01/18/16 0001      AROM   Left Knee Extension -2     PROM   Right Knee Extension 0     Flexibility   Hamstrings R hamstring flexibility 70 degrees                     [facility] Adult PT Treatment/Exercise -  01/18/16 0001      Knee/Hip Exercises: Stretches   Active Hamstring Stretch 2 reps;30 seconds   Piriformis Stretch 2 reps;30 seconds     Ultrasound   Ultrasound Location R proximal hamstring   Ultrasound Parameters 100%, 1.4 w/cm2 x 6 min   Ultrasound Goals Pain     Manual Therapy   Soft tissue mobilization IASTM of R mid hamstring    Myofascial Release fascial stretching/ rolling of the R mid hamstring          Trigger Point Dry Needling - 01/18/16 1719    Consent Given? Yes   Education Handout Provided Yes   Muscles Treated Lower Body Piriformis;Hamstring   Piriformis Response Twitch response elicited;Palpable increased muscle length  x1 with pistoning technique   Hamstring Response Twitch response elicited;Palpable increased muscle length  x 3 with pistoning/ twisting techinque              PT Education - 01/18/16 1738    Education provided Yes   Education Details continue with stretching but to perform it multiple times a day to help relieve tension in the  hamstrings.    Person(s) Educated Patient   Methods Explanation;Verbal cues   Comprehension Verbalized understanding          PT Short Term Goals - 01/18/16 1735      PT SHORT TERM GOAL #1   Title pt will be I with inital HEP as of last visit (01/12/2016)   Time 3   Period Weeks   Status Achieved     PT SHORT TERM GOAL #2   Title pt will be able to verbalize / demo proper techniques to reduce R posterior thigh pain via RICE and HEP (01/12/2016)   Time 3   Period Weeks   Status Achieved           PT Long Term Goals - 01/18/16 1735      PT LONG TERM GOAL #1   Title pt will be I with all HEP given as of last visit (02/02/2016)   Time 6   Period Weeks   Status On-going     PT LONG TERM GOAL #2   Title pt will improve her SLR on the R by >/= 10 degrees with </= 2/10 pain to demonstrate decreased hamstring tighntess (02/02/2016)   Time 6   Period Weeks   Status Partially Met     PT LONG TERM  GOAL #3   Title pt will be able to sit for >/= 45 minutes with </= 2/10 pain to assist with work related requirements (02/02/2016)   Time 6   Period Weeks   Status Partially Met     PT LONG TERM GOAL #4   Title pt will be able  jog for >/= 15 minutes with </= 2/10 pain to assist with personal goal of getting back into running and exercise (02/02/2016)   Time 6   Period Weeks   Status On-going     PT LONG TERM GOAL #5   Title pt will improve her LEFS score to >/= 60 points to demonstrate improvement in function at discharge (02/02/2016)   Time 6   Period Weeks   Status On-going               Plan - 01/18/16 1735    Clinical Impression Statement Lynn Gardner reports increased soreness since over the weekend from doing more than she is used to. Following DN of the piriformis, and hamstrings; pt was monitored throughout treament. following soft tissue work and [place] she reported significant relief of pain and tightness. performed trial of KT tape to promote inhibition. she is progressing well with her goals.    PT Next Visit Plan Manual, stretching, [place] , consider taping, contract relax stretching DN on hamstrings, request more visits.    Consulted and Agree with Plan of Care Patient      Patient will benefit from skilled therapeutic intervention in order to improve the following deficits and impairments:  Pain, Improper body mechanics, Postural dysfunction, Decreased endurance, Decreased activity tolerance, Decreased balance, Hypomobility, Increased fascial restricitons  Visit Diagnosis: Pain in right thigh  Other abnormalities of gait and mobility     Problem List Patient Active Problem List   Diagnosis Date Noted  . Right anterior knee pain 04/28/2015   Starr Lake PT, DPT, LAT, ATC  01/18/16  5:41 PM      [place] [facility] [address] [place], [place], [address] Phone: [phone]   Fax:  [phone]  Name:  Lynn Gardner MRN: [id number] Date of Birth: 01-21-1951

## 2016-01-25 ENCOUNTER — Ambulatory Visit: Payer: PRIVATE HEALTH INSURANCE | Admitting: Physical Therapy

## 2016-01-25 DIAGNOSIS — M79651 Pain in right thigh: Secondary | ICD-10-CM

## 2016-01-25 DIAGNOSIS — R2689 Other abnormalities of gait and mobility: Secondary | ICD-10-CM | POA: Diagnosis not present

## 2016-01-25 NOTE — Therapy (Signed)
[place], [place], [address] Phone: [phone]   Fax:  [phone]  Physical Therapy Treatment  Patient Details  Name: Lynn Gardner MRN: [id number] Date of Birth: 07/05/1950 Referring Provider: Delman Cheadle MD  Encounter Date: 01/25/2016      PT End of Session - 01/25/16 1502    Visit Number 6   Number of Visits 7   Date for PT Re-Evaluation 02/02/16   PT Start Time 2505   PT Stop Time 1500   PT Time Calculation (min) 43 min   Activity Tolerance Patient tolerated treatment well   Behavior During Therapy [facility] for tasks assessed/performed      Past Medical History:  Diagnosis Date  . Allergic rhinitis due to pollen   . Allergy   . Essential hypertension, malignant   . Extrinsic asthma, unspecified   . Fibroids   . H/O osteopenia   . Hyperosmolality and/or hypernatremia   . Hypopotassemia   . Palpitations   . Pure hypercholesterolemia   . Vitamin D deficiency     Past Surgical History:  Procedure Laterality Date  . APPENDECTOMY  1972  . TONSILLECTOMY    . WISDOM TOOTH EXTRACTION      There were no vitals filed for this visit.      Subjective Assessment - 01/25/16 1424    Subjective "I had a pretty good week last week and doing much better today, but still have intermittent spasm, and im at my worse morning and night"    Currently in Pain? Yes   Pain Score 3    Pain Location Leg   Pain Orientation Right;Posterior;Proximal   Pain Descriptors / Indicators Aching   Pain Type Chronic pain   Pain Onset More than a month ago   Pain Frequency Intermittent   Aggravating Factors  prolonged sitting, driving, laying down   Pain Relieving Factors walking, standing, stretching,                          [facility] Adult PT Treatment/Exercise - 01/25/16 1426      Knee/Hip Exercises: Stretches   Active Hamstring Stretch 4 reps;30 seconds  PNF contract/ relax with 10 sec  contraction   Piriformis Stretch 2 reps;30 seconds     Knee/Hip Exercises: Aerobic   Nustep L5 x 5 min  LE only     Knee/Hip Exercises: Seated   Hamstring Curl AROM;Strengthening;Right;1 set;10 reps  yellow theraband, eccentric control    Sit to Sand 2 sets;10 reps     Knee/Hip Exercises: Supine   Bridges with Clamshell AROM;Strengthening;Both;2 sets;15 reps  with yellow band      Manual Therapy   Manual Therapy Taping   Soft tissue mobilization IASTM of R mid hamstring    Myofascial Release fascial stretching/ rolling of the R mid hamstring   Kinesiotex Inhibit Muscle     Kinesiotix   Inhibit Muscle  R semi-tendinosus gong from distal to proximal           Trigger Point Dry Needling - 01/25/16 1427    Consent Given? Yes   Education Handout Provided No  given previously   Muscles Treated Lower Body Hamstring   Hamstring Response Twitch response elicited;Palpable increased muscle length  x4 with pistoning and twisting techniques                PT Short Term Goals - 01/18/16 1735      PT SHORT  TERM GOAL #1   Title pt will be I with inital HEP as of last visit (01/12/2016)   Time 3   Period Weeks   Status Achieved     PT SHORT TERM GOAL #2   Title pt will be able to verbalize / demo proper techniques to reduce R posterior thigh pain via RICE and HEP (01/12/2016)   Time 3   Period Weeks   Status Achieved           PT Long Term Goals - 01/18/16 1735      PT LONG TERM GOAL #1   Title pt will be I with all HEP given as of last visit (02/02/2016)   Time 6   Period Weeks   Status On-going     PT LONG TERM GOAL #2   Title pt will improve her SLR on the R by >/= 10 degrees with </= 2/10 pain to demonstrate decreased hamstring tighntess (02/02/2016)   Time 6   Period Weeks   Status Partially Met     PT LONG TERM GOAL #3   Title pt will be able to sit for >/= 45 minutes with </= 2/10 pain to assist with work related requirements (02/02/2016)   Time 6    Period Weeks   Status Partially Met     PT LONG TERM GOAL #4   Title pt will be able  jog for >/= 15 minutes with </= 2/10 pain to assist with personal goal of getting back into running and exercise (02/02/2016)   Time 6   Period Weeks   Status On-going     PT LONG TERM GOAL #5   Title pt will improve her LEFS score to >/= 60 points to demonstrate improvement in function at discharge (02/02/2016)   Time 6   Period Weeks   Status On-going               Plan - 01/25/16 1502    Clinical Impression Statement Mrs. Lynn Gardner continues to make progress with physical therapy with decreased pain and tightness in the R hamstring. Following Dn of the R hamstring; pt monitored throughout treatment. pt was able to perform exercises today with 0.5/10 pain.    PT Next Visit Plan Manual, stretching, [place] , consider taping, contract relax stretching DN on hamstrings, re-evaluation, updated HEP   Consulted and Agree with Plan of Care Patient      Patient will benefit from skilled therapeutic intervention in order to improve the following deficits and impairments:  Pain, Improper body mechanics, Postural dysfunction, Decreased endurance, Decreased activity tolerance, Decreased balance, Hypomobility, Increased fascial restricitons  Visit Diagnosis: Pain in right thigh  Other abnormalities of gait and mobility     Problem List Patient Active Problem List   Diagnosis Date Noted  . Right anterior knee pain 04/28/2015   Lynn Gardner PT, DPT, LAT, ATC  01/25/16  3:07 PM      [place] [facility] [address] [place], [place], [address] Phone: [phone]   Fax:  [phone]  Name: Lynn Gardner MRN: [id number] Date of Birth: 22-Mar-1951

## 2016-02-01 ENCOUNTER — Ambulatory Visit: Payer: PRIVATE HEALTH INSURANCE | Admitting: Physical Therapy

## 2016-02-01 DIAGNOSIS — R2689 Other abnormalities of gait and mobility: Secondary | ICD-10-CM | POA: Diagnosis not present

## 2016-02-01 DIAGNOSIS — M79651 Pain in right thigh: Secondary | ICD-10-CM

## 2016-02-01 NOTE — Therapy (Signed)
[place], [place], [address] Phone: [phone]   Fax:  [phone]  Physical Therapy Treatment / Recertification  Patient Details  Name: Lynn Gardner MRN: [id number] Date of Birth: 1951-06-06 Referring Provider: Delman Cheadle MD  Encounter Date: 02/01/2016      PT End of Session - 02/01/16 1654    Visit Number 7   Number of Visits 10   Date for PT Re-Evaluation 02/22/16   Authorization Type [facility] U[facility]/ WC    PT Start Time 1638   PT Stop Time 1725   PT Time Calculation (min) 47 min   Activity Tolerance Patient tolerated treatment well   Behavior During Therapy [facility] for tasks assessed/performed      Past Medical History:  Diagnosis Date  . Allergic rhinitis due to pollen   . Allergy   . Essential hypertension, malignant   . Extrinsic asthma, unspecified   . Fibroids   . H/O osteopenia   . Hyperosmolality and/or hypernatremia   . Hypopotassemia   . Palpitations   . Pure hypercholesterolemia   . Vitamin D deficiency     Past Surgical History:  Procedure Laterality Date  . APPENDECTOMY  1972  . TONSILLECTOMY    . WISDOM TOOTH EXTRACTION      There were no vitals filed for this visit.      Subjective Assessment - 02/01/16 1641    Subjective "overall getting still have some soreness / occasionall charlie horse in the back thigh" worse with sitting for a long time.    Currently in Pain? Yes   Pain Score 2    Pain Orientation Left   Pain Descriptors / Indicators Aching;Tightness   Aggravating Factors  prolonged sitting, driving/ getting out of the car   Pain Relieving Factors walking, standing, stretching            [facility] PT Assessment - 02/01/16 1643      Observation/Other Assessments   Lower Extremity Functional Scale  59/80     AROM   Right Knee Extension 0   Right Knee Flexion 136     PROM   Right Knee Extension 0     Strength   Right Knee Flexion 4+/5  mild soreness  during tsting    Right Knee Extension 4+/5     Flexibility   Hamstrings R hamstring flexibility 72 degrees                     [facility] Adult PT Treatment/Exercise - 02/01/16 0001      Knee/Hip Exercises: Stretches   Active Hamstring Stretch Right;2 reps;30 seconds     Knee/Hip Exercises: Aerobic   Elliptical L5 x 5 min   Tread Mill vt     Manual Therapy   Soft tissue mobilization IASTM of R mid hamstring    Myofascial Release fascial stretching/ rolling of the R mid hamstring   Kinesiotex Inhibit Muscle     Kinesiotix   Inhibit Muscle  R semi-tendinosus gong from distal to proximal           Trigger Point Dry Needling - 02/01/16 1657    Consent Given? Yes   Education Handout Provided No   Hamstring Response Twitch response elicited;Palpable increased muscle length  x 3 with pistoning/ twisting technique in prox hamstring                PT Short Term Goals - 01/18/16 1735      PT  SHORT TERM GOAL #1   Title pt will be I with inital HEP as of last visit (01/12/2016)   Time 3   Period Weeks   Status Achieved     PT SHORT TERM GOAL #2   Title pt will be able to verbalize / demo proper techniques to reduce R posterior thigh pain via RICE and HEP (01/12/2016)   Time 3   Period Weeks   Status Achieved           PT Long Term Goals - 02/01/16 1648      PT LONG TERM GOAL #1   Title pt will be I with all HEP given as of last visit (02/02/2016)   Time 6   Period Weeks   Status On-going     PT LONG TERM GOAL #2   Title pt will improve her SLR on the R by >/= 10 degrees with </= 2/10 pain to demonstrate decreased hamstring tighntess (02/02/2016)   Baseline 72 degrees SLR with dull ache 2/10 pain    Time 6   Period Weeks   Status Achieved     PT LONG TERM GOAL #3   Title pt will be able to sit for >/= 45 minutes with </= 2/10 pain to assist with work related requirements (02/02/2016)   Time 6   Period Weeks   Status Partially Met     PT LONG  TERM GOAL #4   Title pt will be able  jog for >/= 15 minutes with </= 2/10 pain to assist with personal goal of getting back into running and exercise (02/02/2016)   Time 6   Period Weeks   Status On-going     PT LONG TERM GOAL #5   Title pt will improve her LEFS score to >/= 60 points to demonstrate improvement in function at discharge (02/02/2016)   Baseline 59/80   Time 6   Period Weeks   Status Partially Met               Plan - 02/01/16 1740    Clinical Impression Statement Lynn Gardner demonstrates improvement in knee mobilty and strength with report of 2/10 pain in the proximal hamstring. She has partially met all LTG except for #1 and # 4 but is progressing well. Focused todays sesson DN to calm down on proximal hamstring tightness; pt was monitored during treatment. Soft tissue work and taping to inhibit the muscle which pt reported relief and was able to exercise on the elliptical reporting no pain. PT would benefit from continued phyiscal therapy to work on remaining goals and progress to independent exercise.    Rehab Potential Excellent   PT Frequency 1x / week   PT Duration 3 weeks   PT Treatment/Interventions ADLs/Self Care Home Management;Cryotherapy;Electrical Stimulation;Iontophoresis 23m/ml Dexamethasone;Moist Heat;Therapeutic exercise;Therapeutic activities;Dry needling;Passive range of motion;Manual techniques;Ultrasound;Taping   PT Next Visit Plan assess response to DN, Manual/ taping PRN, stretching, eccentric strengthing of hamstrings, elliptical trainer with progression, CKC strengthening exercises   PT Home Exercise Plan begin ellipitical    Consulted and Agree with Plan of Care Patient      Patient will benefit from skilled therapeutic intervention in order to improve the following deficits and impairments:  Pain, Improper body mechanics, Postural dysfunction, Decreased endurance, Decreased activity tolerance, Decreased balance, Hypomobility, Increased  fascial restricitons  Visit Diagnosis: Pain in right thigh - Plan: PT plan of care cert/re-cert  Other abnormalities of gait and mobility - Plan: PT plan of care cert/re-cert  Problem List Patient Active Problem List   Diagnosis Date Noted  . Right anterior knee pain 04/28/2015   Starr Lake PT, DPT, LAT, ATC  02/01/16  5:49 PM      [facility] [address] [place], [place], [address] Phone: [phone]   Fax:  [phone]  Name: Lynn Gardner MRN: [id number] Date of Birth: 03/08/1951

## 2016-02-08 ENCOUNTER — Ambulatory Visit: Payer: PRIVATE HEALTH INSURANCE | Admitting: Physical Therapy

## 2016-02-08 DIAGNOSIS — R2689 Other abnormalities of gait and mobility: Secondary | ICD-10-CM

## 2016-02-08 DIAGNOSIS — I10 Essential (primary) hypertension: Secondary | ICD-10-CM | POA: Diagnosis not present

## 2016-02-08 DIAGNOSIS — M79651 Pain in right thigh: Secondary | ICD-10-CM

## 2016-02-08 NOTE — Therapy (Signed)
[place], [place], [address] Phone: [phone]   Fax:  [phone]  Physical Therapy Treatment  Patient Details  Name: Lynn Gardner MRN: [id number] Date of Birth: 11-06-1950 Referring Provider: Delman Cheadle MD  Encounter Date: 02/08/2016      PT End of Session - 02/08/16 1720    Visit Number 8   Number of Visits 10   Date for PT Re-Evaluation 02/22/16   PT Start Time 0175   PT Stop Time 1718   PT Time Calculation (min) 47 min   Activity Tolerance Patient tolerated treatment well   Behavior During Therapy [facility] for tasks assessed/performed      Past Medical History:  Diagnosis Date  . Allergic rhinitis due to pollen   . Allergy   . Essential hypertension, malignant   . Extrinsic asthma, unspecified   . Fibroids   . H/O osteopenia   . Hyperosmolality and/or hypernatremia   . Hypopotassemia   . Palpitations   . Pure hypercholesterolemia   . Vitamin D deficiency     Past Surgical History:  Procedure Laterality Date  . APPENDECTOMY  1972  . TONSILLECTOMY    . WISDOM TOOTH EXTRACTION      There were no vitals filed for this visit.      Subjective Assessment - 02/08/16 1637    Subjective "I am doing better today, still have occasional spasm but it is much more controllable"    Currently in Pain? Yes   Pain Score 1    Pain Location Leg   Pain Orientation Left   Pain Type Chronic pain   Pain Onset More than a month ago   Pain Frequency Intermittent   Aggravating Factors  direct pressure on the sore area, driving.    Pain Relieving Factors walking, standing, stretching                         [facility] Adult PT Treatment/Exercise - 02/08/16 1640      Knee/Hip Exercises: Stretches   Active Hamstring Stretch 4 reps;30 seconds  PNF contract/ relax with 10 sec hold     Knee/Hip Exercises: Aerobic   Elliptical L5, 0 elevation  x 8 min     Knee/Hip Exercises: Machines  for Strengthening   Cybex Knee Flexion 2 x 10 25# down with both, and with RLE only   Total Gym Leg Press 3 sets of 10, 1 set 55#, 1 set 65#, 1 set 75#     Knee/Hip Exercises: Seated   Sit to Sand 2 sets;10 reps  1 set hovering just abov table x 3 sec                PT Education - 02/08/16 1719    Education provided Yes   Education Details Updated HEP for hip strengthening, and hamstring eccentrics   Person(s) Educated Patient   Methods Explanation;Verbal cues;Handout   Comprehension Verbalized understanding;Verbal cues required          PT Short Term Goals - 01/18/16 1735      PT SHORT TERM GOAL #1   Title pt will be I with inital HEP as of last visit (01/12/2016)   Time 3   Period Weeks   Status Achieved     PT SHORT TERM GOAL #2   Title pt will be able to verbalize / demo proper techniques to reduce R posterior thigh pain via RICE and HEP (01/12/2016)  Time 3   Period Weeks   Status Achieved           PT Long Term Goals - 02/01/16 1648      PT LONG TERM GOAL #1   Title pt will be I with all HEP given as of last visit (02/02/2016)   Time 6   Period Weeks   Status On-going     PT LONG TERM GOAL #2   Title pt will improve her SLR on the R by >/= 10 degrees with </= 2/10 pain to demonstrate decreased hamstring tighntess (02/02/2016)   Baseline 72 degrees SLR with dull ache 2/10 pain    Time 6   Period Weeks   Status Achieved     PT LONG TERM GOAL #3   Title pt will be able to sit for >/= 45 minutes with </= 2/10 pain to assist with work related requirements (02/02/2016)   Time 6   Period Weeks   Status Partially Met     PT LONG TERM GOAL #4   Title pt will be able  jog for >/= 15 minutes with </= 2/10 pain to assist with personal goal of getting back into running and exercise (02/02/2016)   Time 6   Period Weeks   Status On-going     PT LONG TERM GOAL #5   Title pt will improve her LEFS score to >/= 60 points to demonstrate improvement in function  at discharge (02/02/2016)   Baseline 59/80   Time 6   Period Weeks   Status Partially Met               Plan - 02/08/16 1720    Clinical Impression Statement Mrs. Slatter continues to progress well with PT. opted to not perform DN today and focus on strengthening of the hip and hamstring with stretching which she performed well and reported no pain following today session.    PT Next Visit Plan assess response to DN, Manual/ taping PRN, stretching, eccentric strengthing of hamstrings, elliptical trainer with progression, CKC strengthening exercises   Consulted and Agree with Plan of Care Patient      Patient will benefit from skilled therapeutic intervention in order to improve the following deficits and impairments:  Pain, Improper body mechanics, Postural dysfunction, Decreased endurance, Decreased activity tolerance, Decreased balance, Hypomobility, Increased fascial restricitons  Visit Diagnosis: Pain in right thigh  Other abnormalities of gait and mobility     Problem List Patient Active Problem List   Diagnosis Date Noted  . Right anterior knee pain 04/28/2015   Starr Lake PT, DPT, LAT, ATC  02/08/16  5:27 PM       [facility] [address] [place], [place], [address] Phone: [phone]   Fax:  [phone]  Name: Lynn Gardner MRN: [id number] Date of Birth: 09-26-1950

## 2016-02-15 ENCOUNTER — Ambulatory Visit: Payer: PRIVATE HEALTH INSURANCE | Attending: Internal Medicine | Admitting: Physical Therapy

## 2016-02-15 DIAGNOSIS — M79651 Pain in right thigh: Secondary | ICD-10-CM | POA: Insufficient documentation

## 2016-02-15 DIAGNOSIS — R2689 Other abnormalities of gait and mobility: Secondary | ICD-10-CM | POA: Insufficient documentation

## 2016-02-15 NOTE — Therapy (Signed)
[place], [place], [address] Phone: [phone]   Fax:  [phone]  Physical Therapy Treatment / Discharge Note  Patient Details  Name: Lynn Gardner MRN: [id number] Date of Birth: Apr 25, 1951 Referring Provider: Delman Cheadle MD  Encounter Date: 02/15/2016      PT End of Session - 02/15/16 1640    Visit Number 9   Number of Visits 10   Date for PT Re-Evaluation 02/22/16   PT Start Time 7672   PT Stop Time 1630   PT Time Calculation (min) 38 min   Activity Tolerance Patient tolerated treatment well   Behavior During Therapy [facility] for tasks assessed/performed      Past Medical History:  Diagnosis Date  . Allergic rhinitis due to pollen   . Allergy   . Essential hypertension, malignant   . Extrinsic asthma, unspecified   . Fibroids   . H/O osteopenia   . Hyperosmolality and/or hypernatremia   . Hypopotassemia   . Palpitations   . Pure hypercholesterolemia   . Vitamin D deficiency     Past Surgical History:  Procedure Laterality Date  . APPENDECTOMY  1972  . TONSILLECTOMY    . WISDOM TOOTH EXTRACTION      There were no vitals filed for this visit.      Subjective Assessment - 02/15/16 1553    Subjective "did the band exercise over the last couple of days and I am feeling alittle sore today with some soreness"    Currently in Pain? Yes   Pain Score 2    Pain Orientation Right   Pain Descriptors / Indicators Sore;Tightness   Pain Type Chronic pain   Pain Onset More than a month ago   Pain Frequency Intermittent   Aggravating Factors  prolong sitting (in the car)   Pain Relieving Factors standing, holding the hamstring            [facility] PT Assessment - 02/15/16 0001      Observation/Other Assessments   Lower Extremity Functional Scale  80/80     AROM   Right Knee Flexion 136     Strength   Right Hip Flexion 4+/5   Right Hip Extension 4/5   Right Hip ABduction 4/5   Right  Hip ADduction 4+/5   Right Knee Flexion 5/5  soreness during testing   Right Knee Extension 5/5     Flexibility   Hamstrings R hamstring flexibility 72 degrees                     [facility] Adult PT Treatment/Exercise - 02/15/16 1600      Knee/Hip Exercises: Stretches   Active Hamstring Stretch Right;3 reps;30 seconds  contract/ relax with 10 sec hold     Knee/Hip Exercises: Aerobic   Elliptical L5, 0 elevation  x 5 min     Knee/Hip Exercises: Standing   Other Standing Knee Exercises jogging x 30 ft 10% of sprint, jogging x 30 ft 25% of sprint  reported it felt good, mild soreness .75/10 pain   Other Standing Knee Exercises hopping 1 x 8                PT Education - 02/15/16 1638    Education provided Yes   Education Details reviewed HEP with specific education stating she can stretch multiple times a day no more than 30 sec at a time, but only needs to execise 1 x a day. Todays soreness  is from pt doing too much and has delayed onset muscle soreness.    Person(s) Educated Patient   Methods Explanation;Verbal cues   Comprehension Verbalized understanding;Verbal cues required          PT Short Term Goals - 01/18/16 1735      PT SHORT TERM GOAL #1   Title pt will be I with inital HEP as of last visit (01/12/2016)   Time 3   Period Weeks   Status Achieved     PT SHORT TERM GOAL #2   Title pt will be able to verbalize / demo proper techniques to reduce R posterior thigh pain via RICE and HEP (01/12/2016)   Time 3   Period Weeks   Status Achieved           PT Long Term Goals - 02/15/16 1627      PT LONG TERM GOAL #1   Title pt will be I with all HEP given as of last visit (02/02/2016)   Time 6   Period Weeks   Status Achieved     PT LONG TERM GOAL #2   Title pt will improve her SLR on the R by >/= 10 degrees with </= 2/10 pain to demonstrate decreased hamstring tighntess (02/02/2016)   Time 6   Period Weeks   Status Achieved     PT LONG TERM  GOAL #3   Title pt will be able to sit for >/= 45 minutes with </= 2/10 pain to assist with work related requirements (02/02/2016)   Time 6   Period Weeks   Status Achieved     PT LONG TERM GOAL #4   Title pt will be able  jog for >/= 15 minutes with </= 2/10 pain to assist with personal goal of getting back into running and exercise (02/02/2016)   Time 6   Period Weeks   Status Achieved     PT LONG TERM GOAL #5   Title pt will improve her LEFS score to >/= 60 points to demonstrate improvement in function at discharge (02/02/2016)   Period Weeks   Status Achieved               Plan - 02/15/16 1642    Clinical Impression Statement Mrs. Mattera has made great progress with PT, She improved her knee AROM and her R hip and knee strength with mild soreness noted during testing. Soreness in the hamstring largely due to doing her strengthening exercises 3 x a day which was written for only 1 x a day and she was only sore. Following ellipitcal with stretching she reported decreased soreness. She was able to do light running and hopping and improved her LEFS to 80/80.  she met all goals today, pt reports she is able to maintain and progress her current level of function independently and will be discharged from PT today.    PT Next Visit Plan discharge   Consulted and Agree with Plan of Care Patient      Patient will benefit from skilled therapeutic intervention in order to improve the following deficits and impairments:  Pain, Improper body mechanics, Postural dysfunction, Decreased endurance, Decreased activity tolerance, Decreased balance, Hypomobility, Increased fascial restricitons  Visit Diagnosis: Pain in right thigh  Other abnormalities of gait and mobility     Problem List Patient Active Problem List   Diagnosis Date Noted  . Right anterior knee pain 04/28/2015   Starr Lake PT, DPT, LAT, ATC  02/15/16  4:46 PM  [place], [place], [address] Phone: [phone]   Fax:  [phone]  Name: GWENDOLYNN MERKEY MRN: [id number] Date of Birth: 08-25-1950   PHYSICAL THERAPY DISCHARGE SUMMARY  Visits from Start of Care: 9  Current functional level related to goals / functional outcomes: FOTO 80/80   Remaining deficits: Intermittent R hamstring soreness/ tightness that is relieved with stretching and exerciese   Education / Equipment: HEP, theraband for strengthening, gradual progression for exercise Plan: Patient agrees to discharge.  Patient goals were met. Patient is being discharged due to meeting the stated rehab goals.  ?????

## 2016-02-18 MED FILL — AMLODIPINE BESYLATE 5 MG TA: 5 | 90 days supply | Qty: 135 | Fill #1

## 2016-02-21 MED FILL — TRIAMTERENE-HCTZ 37.5-25 MG: 37.5-25 | 90 days supply | Qty: 90 | Fill #0

## 2016-03-02 DIAGNOSIS — J309 Allergic rhinitis, unspecified: Secondary | ICD-10-CM | POA: Diagnosis not present

## 2016-03-02 DIAGNOSIS — E559 Vitamin D deficiency, unspecified: Secondary | ICD-10-CM | POA: Diagnosis not present

## 2016-03-02 DIAGNOSIS — I10 Essential (primary) hypertension: Secondary | ICD-10-CM | POA: Diagnosis not present

## 2016-03-02 DIAGNOSIS — S76311S Strain of muscle, fascia and tendon of the posterior muscle group at thigh level, right thigh, sequela: Secondary | ICD-10-CM | POA: Diagnosis not present

## 2016-03-07 MED FILL — traMADol HCL 50 MG TABS: 50 | 10 days supply | Qty: 20 | Fill #0

## 2016-03-13 ENCOUNTER — Ambulatory Visit: Payer: PRIVATE HEALTH INSURANCE | Attending: Internal Medicine | Admitting: Physical Therapy

## 2016-03-13 DIAGNOSIS — R2689 Other abnormalities of gait and mobility: Secondary | ICD-10-CM | POA: Insufficient documentation

## 2016-03-13 DIAGNOSIS — M79651 Pain in right thigh: Secondary | ICD-10-CM | POA: Insufficient documentation

## 2016-03-13 DIAGNOSIS — M62838 Other muscle spasm: Secondary | ICD-10-CM | POA: Diagnosis present

## 2016-03-13 DIAGNOSIS — G8929 Other chronic pain: Secondary | ICD-10-CM | POA: Diagnosis present

## 2016-03-13 DIAGNOSIS — M545 Low back pain: Secondary | ICD-10-CM | POA: Insufficient documentation

## 2016-03-13 NOTE — Therapy (Signed)
[place], [place], [address] Phone: [phone]   Fax:  [phone]  Physical Therapy Treatment / Re-certification  Patient Details  Name: Lynn Gardner MRN: [id number] Date of Birth: Jul 06, 1950 Referring Provider: Odis Luster  Encounter Date: 03/13/2016      PT End of Session - 03/13/16 1748    Visit Number 10   Number of Visits 22   Date for PT Re-Evaluation 04/24/16   Authorization Type [facility] UMR/ WC    PT Start Time [id number]   PT Stop Time 1642   PT Time Calculation (min) 54 min   Activity Tolerance Patient tolerated treatment well   Behavior During Therapy [facility] for tasks assessed/performed      Past Medical History:  Diagnosis Date  . Allergic rhinitis due to pollen   . Allergy   . Essential hypertension, malignant   . Extrinsic asthma, unspecified   . Fibroids   . H/O osteopenia   . Hyperosmolality and/or hypernatremia   . Hypopotassemia   . Palpitations   . Pure hypercholesterolemia   . Vitamin D deficiency     Past Surgical History:  Procedure Laterality Date  . APPENDECTOMY  1972  . TONSILLECTOMY    . WISDOM TOOTH EXTRACTION      There were no vitals filed for this visit.      Subjective Assessment - 03/13/16 1554    Subjective pt reports that she was lifting a box and twisting and wasn't sure if she felt or heard a pop, and had immediate pain the R low back and the R hip that occurred 03/02/2016. She reports that bought a back brace and it has helped, but still having pain in the hamsting and hip"   Currently in Pain? Yes   Pain Score 9    Pain Location Leg   Pain Orientation Right   Pain Descriptors / Indicators Sore;Aching;Tightness  pulling   Pain Type --  sub-acute   Pain Radiating Towards to the posterior knee and proximal/ mid calf   Pain Onset 1 to 4 weeks ago   Pain Frequency Constant   Aggravating Factors  prolong sitting, laying down at night, gettingout of  bed   Pain Relieving Factors heating pad, muscle relaxer,    Multiple Pain Sites Yes   Pain Score 2   Pain Location Back   Pain Orientation Right   Pain Descriptors / Indicators Aching;Sore   Pain Type Acute pain   Pain Onset More than a month ago   Pain Frequency Constant   Aggravating Factors  prolong sitting, laying down and gettiong out of bed   Pain Relieving Factors muscle relaxer, MHP            [facility] PT Assessment - 03/13/16 0001      Assessment   Medical Diagnosis R Gluteal tendintis / hamstrings strain   Referring Provider thomas Kingsley   Onset Date/Surgical Date --  2weeks     Observation/Other Assessments   Lower Extremity Functional Scale  40/80     AROM   Right Knee Extension -3  ERP   Right Knee Flexion 136     Flexibility   Hamstrings R hamstring flexibility -50 degrees  with hip at 90 degrees flexion     Palpation   Palpation comment tenderness in the R piriformis with referral of pain in the R calf upon palpation,  and proximal hamstring at the ischial tuberosty  [facility] PT Treatment/Exercise - 03/13/16 0001      Knee/Hip Exercises: Stretches   Active Hamstring Stretch 2 reps;30 seconds   Piriformis Stretch 4 reps;30 seconds  contract/ relax with 10 sec contraction     Manual Therapy   Soft tissue mobilization IASTM of R mid hamstring           Trigger Point Dry Needling - 03/13/16 1613    Consent Given? Yes   Education Handout Provided Yes  given previously   Piriformis Response Twitch response elicited;Palpable increased muscle length  x 3 with pistoning/ twisting techniques   Hamstring Response Twitch response elicited;Palpable increased muscle length  x 6 with pistoning/ twisting techniques              PT Education - 03/13/16 1747    Education provided Yes   Education Details reviewed HEP and updated exercises for piriformis stretching. Discussed POC to include lifting and carrying  mechanics based mechanism of injury.    Person(s) Educated Patient   Methods Explanation;Verbal cues;Demonstration;Handout   Comprehension Verbalized understanding;Verbal cues required;Returned demonstration          PT Short Term Goals - 01/18/16 1735      PT SHORT TERM GOAL #1   Title pt will be I with inital HEP as of last visit (01/12/2016)   Time 3   Period Weeks   Status Achieved     PT SHORT TERM GOAL #2   Title pt will be able to verbalize / demo proper techniques to reduce R posterior thigh pain via RICE and HEP (01/12/2016)   Time 3   Period Weeks   Status Achieved           PT Long Term Goals - 03/13/16 1754      PT LONG TERM GOAL #1   Title pt will be I with all HEP given as of last visit (04/24/2016)   Time 6   Period Weeks   Status Revised     PT LONG TERM GOAL #2   Title pt will improve her SLR on the R by >/= -20 degrees with </= 2/10 pain to demonstrate decreased hamstring tighntess (02/02/2016)   Baseline -50 with hip at 90 degrees flexion degrees SLR with dull ache 5/10 pain    Time 6   Period Weeks   Status Revised     PT LONG TERM GOAL #3   Title pt will be able to sit for >/= 45 minutes with </= 2/10 pain to assist with work related requirements (02/02/2016)   Period Weeks   Status Revised     PT LONG TERM GOAL #4   Title pt will be able  jog for >/= 15 minutes with </= 2/10 pain to assist with personal goal of getting back into running and exercise (02/02/2016)   Time 6   Period Weeks   Status Revised     PT LONG TERM GOAL #5   Title pt will improve her LEFS score to >/= 60 points to demonstrate improvement in function at discharge (02/02/2016)   Baseline current score 40   Time 6   Period Weeks   Status Revised               Plan - 03/13/16 1750    Clinical Impression Statement Mrs. Stankowski reports an acute exacerbation of symptoms due to lifting a box at work and twisting with pain in the r glute region and reinjury of the  hamstrings. Further assessment  revealed tightness of the R piriformis and with referral of pain down the R calf, and semi-membranousus. she is limited in knee and hip flexibility secondary to pain and tightness and would benefit from continued physical therapy to decrease pain and tightness, increase mobility and return her to her PLOF by addressing the deficits listed below.    PT Frequency 2x / week   PT Duration 6 weeks   PT Treatment/Interventions ADLs/Self Care Home Management;Cryotherapy;Electrical Stimulation;Iontophoresis 4mg /ml Dexamethasone;Moist Heat;Therapeutic exercise;Therapeutic activities;Dry needling;Passive range of motion;Manual techniques;Ultrasound;Taping   PT Next Visit Plan assess response to DN, manual/soft tissue work, [place] vs ionto, tack and stretch for piriformis, KT tape   PT Home Exercise Plan piriformis stretching   Consulted and Agree with Plan of Care Patient      Patient will benefit from skilled therapeutic intervention in order to improve the following deficits and impairments:  Pain, Improper body mechanics, Postural dysfunction, Decreased endurance, Decreased activity tolerance, Decreased balance, Hypomobility, Increased fascial restricitons  Visit Diagnosis: Pain in right thigh - Plan: PT plan of care cert/re-cert  Other abnormalities of gait and mobility - Plan: PT plan of care cert/re-cert  Other muscle spasm - Plan: PT plan of care cert/re-cert     Problem List Patient Active Problem List   Diagnosis Date Noted  . Right anterior knee pain 04/28/2015   Starr Lake PT, DPT, LAT, ATC  03/13/16  6:00 PM      [facility] [address] [place], [place], [address] Phone: [phone]   Fax:  [phone]  Name: Lynn Gardner MRN: [id number] Date of Birth: [birth year]

## 2016-03-16 ENCOUNTER — Ambulatory Visit: Payer: PRIVATE HEALTH INSURANCE | Admitting: Physical Therapy

## 2016-03-16 DIAGNOSIS — R2689 Other abnormalities of gait and mobility: Secondary | ICD-10-CM

## 2016-03-16 DIAGNOSIS — M79651 Pain in right thigh: Secondary | ICD-10-CM | POA: Diagnosis not present

## 2016-03-16 DIAGNOSIS — M62838 Other muscle spasm: Secondary | ICD-10-CM

## 2016-03-16 NOTE — Therapy (Signed)
[place], [place], [address] Phone: [phone]   Fax:  [phone]  Physical Therapy Treatment  Patient Details  Name: Lynn Gardner MRN: [id number] Date of Birth: December 23, 1950 Referring Provider: Odis Luster  Encounter Date: 03/16/2016      PT End of Session - 03/16/16 1641    Visit Number 11   Number of Visits 22   Date for PT Re-Evaluation 04/24/16   PT Start Time [id number]   PT Stop Time 1640   PT Time Calculation (min) 54 min   Activity Tolerance Patient tolerated treatment well   Behavior During Therapy [facility] for tasks assessed/performed      Past Medical History:  Diagnosis Date  . Allergic rhinitis due to pollen   . Allergy   . Essential hypertension, malignant   . Extrinsic asthma, unspecified   . Fibroids   . H/O osteopenia   . Hyperosmolality and/or hypernatremia   . Hypopotassemia   . Palpitations   . Pure hypercholesterolemia   . Vitamin D deficiency     Past Surgical History:  Procedure Laterality Date  . APPENDECTOMY  1972  . TONSILLECTOMY    . WISDOM TOOTH EXTRACTION      There were no vitals filed for this visit.      Subjective Assessment - 03/16/16 1554    Subjective "some relief of tightness, but still feeling very achey today"   Currently in Pain? Yes   Pain Score 9    Pain Orientation Right   Pain Descriptors / Indicators Aching;Sore   Pain Type Chronic pain   Pain Frequency Constant                         [facility] Adult PT Treatment/Exercise - 03/16/16 0001      Knee/Hip Exercises: Stretches   Active Hamstring Stretch 2 reps;30 seconds   Piriformis Stretch 4 reps;30 seconds     Modalities   Modalities Moist Heat     Moist Heat Therapy   Number Minutes Moist Heat 10 Minutes   Moist Heat Location Hip  hamstring     Ultrasound   Ultrasound Location R piriformis   Ultrasound Parameters 1 mhz, 1.5 w/cm2 x 8 min   Ultrasound Goals  Pain     Manual Therapy   Soft tissue mobilization IASTM of R mid hamstring / piriformis   Myofascial Release fascial stretching/ rolling of the R mid hamstring, tack and stretching of the piriformis          Trigger Point Dry Needling - 03/16/16 1601    Piriformis Response Twitch response elicited;Palpable increased muscle length  x 2    Hamstring Response Twitch response elicited;Palpable increased muscle length  x 4 in proximal semi-tendinousus              PT Education - 03/16/16 1640    Education provided Yes   Education Details anatomy of the piriformis regarding sciatic nerve and benefits of stretching.    Person(s) Educated Patient   Methods Explanation;Verbal cues   Comprehension Verbalized understanding;Verbal cues required          PT Short Term Goals - 01/18/16 1735      PT SHORT TERM GOAL #1   Title pt will be I with inital HEP as of last visit (01/12/2016)   Time 3   Period Weeks   Status Achieved     PT SHORT TERM GOAL #2   Title  pt will be able to verbalize / demo proper techniques to reduce R posterior thigh pain via RICE and HEP (01/12/2016)   Time 3   Period Weeks   Status Achieved           PT Long Term Goals - 03/13/16 1754      PT LONG TERM GOAL #1   Title pt will be I with all HEP given as of last visit (04/24/2016)   Time 6   Period Weeks   Status Revised     PT LONG TERM GOAL #2   Title pt will improve her SLR on the R by >/= -20 degrees with </= 2/10 pain to demonstrate decreased hamstring tighntess (02/02/2016)   Baseline -50 with hip at 90 degrees flexion degrees SLR with dull ache 5/10 pain    Time 6   Period Weeks   Status Revised     PT LONG TERM GOAL #3   Title pt will be able to sit for >/= 45 minutes with </= 2/10 pain to assist with work related requirements (02/02/2016)   Period Weeks   Status Revised     PT LONG TERM GOAL #4   Title pt will be able  jog for >/= 15 minutes with </= 2/10 pain to assist with  personal goal of getting back into running and exercise (02/02/2016)   Time 6   Period Weeks   Status Revised     PT LONG TERM GOAL #5   Title pt will improve her LEFS score to >/= 60 points to demonstrate improvement in function at discharge (02/02/2016)   Baseline current score 40   Time 6   Period Weeks   Status Revised               Plan - 03/16/16 1641    Clinical Impression Statement Mrs. Lynn Gardner reports still having a 9/10 pain. Focused on DN for the hamstrings and piriformis with [place] and IASTM over the piriformis to relieve tightness hwich she reported pain dropped to 7/10. continued MHP post session to calm down soreness.    PT Next Visit Plan assess response to DN, manual/soft tissue work, [place] vs ionto, tack and stretch for piriformis, KT tape,    Consulted and Agree with Plan of Care Patient      Patient will benefit from skilled therapeutic intervention in order to improve the following deficits and impairments:  Pain, Improper body mechanics, Postural dysfunction, Decreased endurance, Decreased activity tolerance, Decreased balance, Hypomobility, Increased fascial restricitons  Visit Diagnosis: Pain in right thigh  Other abnormalities of gait and mobility  Other muscle spasm     Problem List Patient Active Problem List   Diagnosis Date Noted  . Right anterior knee pain 04/28/2015   Starr Lake PT, DPT, LAT, ATC  03/16/16  4:47 PM      [facility] [address] [place], [place], [address] Phone: [phone]   Fax:  [phone]  Name: Lynn Gardner MRN: [id number] Date of Birth: 1950/09/29

## 2016-03-23 MED FILL — traMADol HCL 50 MG TABS: 50 | 15 days supply | Qty: 30 | Fill #0

## 2016-03-27 ENCOUNTER — Ambulatory Visit: Payer: PRIVATE HEALTH INSURANCE | Admitting: Physical Therapy

## 2016-03-27 DIAGNOSIS — R2689 Other abnormalities of gait and mobility: Secondary | ICD-10-CM

## 2016-03-27 DIAGNOSIS — M79651 Pain in right thigh: Secondary | ICD-10-CM

## 2016-03-27 DIAGNOSIS — M62838 Other muscle spasm: Secondary | ICD-10-CM

## 2016-03-27 NOTE — Patient Instructions (Signed)

## 2016-03-27 NOTE — Therapy (Signed)
[place], [place], [address] Phone: [phone]   Fax:  [phone]  Physical Therapy Treatment  Patient Details  Name: Lynn Gardner MRN: [id number] Date of Birth: 01/22/1951 Referring Provider: Odis Luster  Encounter Date: 03/27/2016      PT End of Session - 03/27/16 1251    Visit Number 12   Number of Visits 22   Date for PT Re-Evaluation 04/24/16   Authorization Type [facility] UMR/ WC    PT Start Time 1148   PT Stop Time 1234   PT Time Calculation (min) 46 min   Activity Tolerance Patient tolerated treatment well   Behavior During Therapy [facility] for tasks assessed/performed      Past Medical History:  Diagnosis Date  . Allergic rhinitis due to pollen   . Allergy   . Essential hypertension, malignant   . Extrinsic asthma, unspecified   . Fibroids   . H/O osteopenia   . Hyperosmolality and/or hypernatremia   . Hypopotassemia   . Palpitations   . Pure hypercholesterolemia   . Vitamin D deficiency     Past Surgical History:  Procedure Laterality Date  . APPENDECTOMY  1972  . TONSILLECTOMY    . WISDOM TOOTH EXTRACTION      There were no vitals filed for this visit.      Subjective Assessment - 03/27/16 1151    Subjective "I was sore the last couple of days, but I am feeling better today at about 6/10, but it does fluctuate"    Currently in Pain? Yes   Pain Score 6    Pain Orientation Right   Pain Type Chronic pain   Pain Onset 1 to 4 weeks ago   Pain Frequency Constant   Aggravating Factors  laying down at night, getting out of bed or seated position   Pain Relieving Factors heating pad, muscle relaxer, ice                          [facility] Adult PT Treatment/Exercise - 03/27/16 0001      Knee/Hip Exercises: Stretches   Active Hamstring Stretch 3 reps;30 seconds   Piriformis Stretch 2 reps;30 seconds     Iontophoresis   Type of Iontophoresis Dexamethasone    Location piriformis    Dose 1cc, 4 mg/ml   Time 6 hour patch     Manual Therapy   Manual therapy comments tack and stretch of the piriformis   Soft tissue mobilization IASTM of R mid hamstring / piriformis   Myofascial Release fascial stretching/ rolling of the R mid hamstring, tack and stretching of the piriformis   Kinesiotex Facilitate Muscle     Kinesiotix   Inhibit Muscle  R semi-tendinosus gong from distal to proximal           Trigger Point Dry Needling - 03/27/16 1216    Consent Given? Yes   Education Handout Provided Yes  given previously   Piriformis Response Twitch response elicited;Palpable increased muscle length   Hamstring Response Twitch response elicited;Palpable increased muscle length              PT Education - 03/27/16 1248    Education provided Yes   Education Details iontophoresis education.    Person(s) Educated Patient   Methods Explanation;Verbal cues;Handout   Comprehension Verbalized understanding;Verbal cues required          PT Short Term Goals - 01/18/16 1735  PT SHORT TERM GOAL #1   Title pt will be I with inital HEP as of last visit (01/12/2016)   Time 3   Period Weeks   Status Achieved     PT SHORT TERM GOAL #2   Title pt will be able to verbalize / demo proper techniques to reduce R posterior thigh pain via RICE and HEP (01/12/2016)   Time 3   Period Weeks   Status Achieved           PT Long Term Goals - 03/13/16 1754      PT LONG TERM GOAL #1   Title pt will be I with all HEP given as of last visit (04/24/2016)   Time 6   Period Weeks   Status Revised     PT LONG TERM GOAL #2   Title pt will improve her SLR on the R by >/= -20 degrees with </= 2/10 pain to demonstrate decreased hamstring tighntess (02/02/2016)   Baseline -50 with hip at 90 degrees flexion degrees SLR with dull ache 5/10 pain    Time 6   Period Weeks   Status Revised     PT LONG TERM GOAL #3   Title pt will be able to sit for >/= 45  minutes with </= 2/10 pain to assist with work related requirements (02/02/2016)   Period Weeks   Status Revised     PT LONG TERM GOAL #4   Title pt will be able  jog for >/= 15 minutes with </= 2/10 pain to assist with personal goal of getting back into running and exercise (02/02/2016)   Time 6   Period Weeks   Status Revised     PT LONG TERM GOAL #5   Title pt will improve her LEFS score to >/= 60 points to demonstrate improvement in function at discharge (02/02/2016)   Baseline current score 40   Time 6   Period Weeks   Status Revised               Plan - 03/27/16 1251    Clinical Impression Statement Mrs. Mollenhauer continues to have pain rated at 6/10. Continued DN of the semi-tendinousus/ piriformis muscles which she reported relief with, as well with soft tissue work. applied KT along the hamstring to inhibit the muscle and ionotphoresis over the piriformis to calm down inflammation. post session she reported pain dropped to 4/10.   PT Next Visit Plan assess response to DN, manual/soft tissue work, [place] vs ionto, tack and stretch for piriformis, KT tape, progress strengthening, See if pt is approved through W/C   Consulted and Agree with Plan of Care Patient      Patient will benefit from skilled therapeutic intervention in order to improve the following deficits and impairments:  Pain, Improper body mechanics, Postural dysfunction, Decreased endurance, Decreased activity tolerance, Decreased balance, Hypomobility, Increased fascial restricitons  Visit Diagnosis: Pain in right thigh  Other abnormalities of gait and mobility  Other muscle spasm     Problem List Patient Active Problem List   Diagnosis Date Noted  . Right anterior knee pain 04/28/2015   Starr Lake PT, DPT, LAT, ATC  03/27/16  12:54 PM      [facility] [address] [place], [place], [address] Phone: [phone]   Fax:   [phone]  Name: ILISHA VIELMA MRN: [id number] Date of Birth: 04-10-1951

## 2016-03-30 ENCOUNTER — Ambulatory Visit: Payer: PRIVATE HEALTH INSURANCE | Admitting: Physical Therapy

## 2016-03-30 DIAGNOSIS — R2689 Other abnormalities of gait and mobility: Secondary | ICD-10-CM

## 2016-03-30 DIAGNOSIS — M79651 Pain in right thigh: Secondary | ICD-10-CM

## 2016-03-30 DIAGNOSIS — M62838 Other muscle spasm: Secondary | ICD-10-CM

## 2016-03-30 NOTE — Therapy (Signed)
[place], [place], [address] Phone: [phone]   Fax:  [phone]  Physical Therapy Treatment  Patient Details  Name: Lynn Gardner MRN: [id number] Date of Birth: 05-31-1951 Referring Provider: Odis Luster  Encounter Date: 03/30/2016      PT End of Session - 03/30/16 1135    Visit Number 13   Number of Visits 22   Date for PT Re-Evaluation 04/24/16   Authorization Type [facility] UMR/ WC    PT Start Time 1100   PT Stop Time 1146   PT Time Calculation (min) 46 min   Activity Tolerance Patient tolerated treatment well   Behavior During Therapy [facility] for tasks assessed/performed      Past Medical History:  Diagnosis Date  . Allergic rhinitis due to pollen   . Allergy   . Essential hypertension, malignant   . Extrinsic asthma, unspecified   . Fibroids   . H/O osteopenia   . Hyperosmolality and/or hypernatremia   . Hypopotassemia   . Palpitations   . Pure hypercholesterolemia   . Vitamin D deficiency     Past Surgical History:  Procedure Laterality Date  . APPENDECTOMY  1972  . TONSILLECTOMY    . WISDOM TOOTH EXTRACTION      There were no vitals filed for this visit.      Subjective Assessment - 03/30/16 1056    Subjective "I am doing alittle better but still having to take medication to calm down soreness.    Currently in Pain? Yes   Pain Score 5    Pain Location Leg   Pain Orientation Right                         [facility] Adult PT Treatment/Exercise - 03/30/16 0001      Knee/Hip Exercises: Aerobic   Nustep Nu-Step L 5 x 6 min  LE only     Knee/Hip Exercises: Sidelying   Clams 2 x 15 and reverse clam shells 2  x 10     Iontophoresis   Type of Iontophoresis Dexamethasone   Location piriformis    Dose 1cc, 4 mg/ml   Time 6 hour patch     Manual Therapy   Manual therapy comments tack and stretch of the piriformis   Soft tissue mobilization IASTM of R mid  hamstring / piriformis   Myofascial Release fascial stretching/ rolling of the R mid hamstring, tack and stretching of the piriformis          Trigger Point Dry Needling - 03/30/16 1121    Consent Given? Yes   Education Handout Provided Yes  given prevously   Piriformis Response Twitch response elicited;Palpable increased muscle length   Hamstring Response Twitch response elicited;Palpable increased muscle length                PT Short Term Goals - 01/18/16 1735      PT SHORT TERM GOAL #1   Title pt will be I with inital HEP as of last visit (01/12/2016)   Time 3   Period Weeks   Status Achieved     PT SHORT TERM GOAL #2   Title pt will be able to verbalize / demo proper techniques to reduce R posterior thigh pain via RICE and HEP (01/12/2016)   Time 3   Period Weeks   Status Achieved           PT Long Term Goals - 03/13/16  [place] #1   Title pt will be I with all HEP given as of last visit (04/24/2016)   Time 6   Period Weeks   Status Revised     PT LONG TERM GOAL #2   Title pt will improve her SLR on the R by >/= -20 degrees with </= 2/10 pain to demonstrate decreased hamstring tighntess (02/02/2016)   Baseline -50 with hip at 90 degrees flexion degrees SLR with dull ache 5/10 pain    Time 6   Period Weeks   Status Revised     PT LONG TERM GOAL #3   Title pt will be able to sit for >/= 45 minutes with </= 2/10 pain to assist with work related requirements (02/02/2016)   Period Weeks   Status Revised     PT LONG TERM GOAL #4   Title pt will be able  jog for >/= 15 minutes with </= 2/10 pain to assist with personal goal of getting back into running and exercise (02/02/2016)   Time 6   Period Weeks   Status Revised     PT LONG TERM GOAL #5   Title pt will improve her LEFS score to >/= 60 points to demonstrate improvement in function at discharge (02/02/2016)   Baseline current score 40   Time 6   Period Weeks   Status Revised                Plan - 03/30/16 1135    Clinical Impression Statement [name] reports continued tightness in the piriformis/ hamtring with improvement in pain since last session. continued DN of the piriformis and hamstrings, following with soft tissue work and manual stretching.  she was able to do exercises given with report of soreness but was able to complete them. post session pt reported pain at 4/10.    PT Next Visit Plan assess response to DN, manual/soft tissue work, [place] vs ionto, tack and stretch for piriformis, KT tape, progress strengthening.   PT Home Exercise Plan piriformis stretching   Consulted and Agree with Plan of Care Patient      Patient will benefit from skilled therapeutic intervention in order to improve the following deficits and impairments:  Pain, Improper body mechanics, Postural dysfunction, Decreased endurance, Decreased activity tolerance, Decreased balance, Hypomobility, Increased fascial restricitons  Visit Diagnosis: Pain in right thigh  Other abnormalities of gait and mobility  Other muscle spasm     Problem List Patient Active Problem List   Diagnosis Date Noted  . Right anterior knee pain 04/28/2015   Starr Lake PT, DPT, LAT, ATC  03/30/16  11:50 AM      [facility] [address] [place], [place], [address] Phone: [phone]   Fax:  [phone]  Name: Lynn Gardner MRN: [id number] Date of Birth: 1950-06-26

## 2016-04-04 ENCOUNTER — Ambulatory Visit: Payer: PRIVATE HEALTH INSURANCE | Admitting: Physical Therapy

## 2016-04-04 DIAGNOSIS — M79651 Pain in right thigh: Secondary | ICD-10-CM | POA: Diagnosis not present

## 2016-04-04 DIAGNOSIS — M62838 Other muscle spasm: Secondary | ICD-10-CM

## 2016-04-04 DIAGNOSIS — G8929 Other chronic pain: Secondary | ICD-10-CM

## 2016-04-04 DIAGNOSIS — M545 Low back pain: Secondary | ICD-10-CM

## 2016-04-04 DIAGNOSIS — R2689 Other abnormalities of gait and mobility: Secondary | ICD-10-CM

## 2016-04-04 MED FILL — DICLOFENAC SOD EC 50 MG TAB: 50 | 30 days supply | Qty: 60 | Fill #0

## 2016-04-04 NOTE — Therapy (Signed)
[place], [place], [address] Phone: [phone]   Fax:  [phone]  Physical Therapy Treatment / Re-evaluation for low back  Patient Details  Name: Lynn Gardner MRN: [id number] Date of Birth: Jun 27, 1950 Referring Provider: Odis Luster  Encounter Date: 04/04/2016      PT End of Session - 04/04/16 1656    Visit Number 14   Number of Visits 22   Date for PT Re-Evaluation 04/24/16   PT Start Time 1632   PT Stop Time 1722   PT Time Calculation (min) 50 min   Activity Tolerance Patient tolerated treatment well   Behavior During Therapy [facility] for tasks assessed/performed      Past Medical History:  Diagnosis Date  . Allergic rhinitis due to pollen   . Allergy   . Essential hypertension, malignant   . Extrinsic asthma, unspecified   . Fibroids   . H/O osteopenia   . Hyperosmolality and/or hypernatremia   . Hypopotassemia   . Palpitations   . Pure hypercholesterolemia   . Vitamin D deficiency     Past Surgical History:  Procedure Laterality Date  . APPENDECTOMY  1972  . TONSILLECTOMY    . WISDOM TOOTH EXTRACTION      There were no vitals filed for this visit.      Subjective Assessment - 04/04/16 1636    Subjective "pt presents to therapy with a new script from the workers comp MD request treatment for the low back with specifics for meckenzie approach, thinking it may be a disc hernation"    Currently in Pain? Yes   Pain Score 6    Pain Location Hip   Pain Orientation Right   Pain Descriptors / Indicators Aching;Pins and needles;Sore   Pain Type Chronic pain   Pain Onset 1 to 4 weeks ago   Pain Frequency Constant   Aggravating Factors  prolonged sitting, laying down at night, getting out of seated position and driving   Pain Relieving Factors heating pad, DN , muscle relaxer, ice            [facility] PT Assessment - 04/04/16 0001      AROM   AROM Assessment Site Lumbar   Lumbar Flexion 46  pain with referral to R foot   Lumbar Extension 10  pinching sensation   Lumbar - Right Side Bend 13    Lumbar - Left Side Bend 13     Palpation   Palpation comment tenderness in the R piriformis with referral of pain in the R calf upon palpation,  and proximal hamstring at the ischial tuberosty and the R calf with palpation                     [facility] Adult PT Treatment/Exercise - 04/04/16 0001      Lumbar Exercises: Stretches   Prone on Elbows Stretch 5 reps;60 seconds  x 2 sets   Press Ups 5 reps;60 seconds  x 2 sets   Press Ups Limitations reported mild peripherlizations with intermittent conintued referral to the R calf      Modalities   Modalities Electrical Stimulation     Electrical Stimulation   Electrical Stimulation Location R piriformis   Electrical Stimulation Action Pre-mod E-stim for DN   Electrical Stimulation Parameters CRP frequency set at 15, intenisty at 2nd solid dot x 10 min   Electrical Stimulation Goals Pain;Other (comment)  fatigue muscle     Iontophoresis  Type of Iontophoresis Dexamethasone   Location piriformis    Dose 1cc, 4 mg/ml   Time 6 hour patch     Manual Therapy   Manual therapy comments tack and stretch of the piriformis   Myofascial Release fascial stretching/ rolling of the R mid hamstring, tack and stretching of the piriformis                PT Education - 04/04/16 1655    Education provided Yes   Education Details education regarding discogenic pain and biomechanics with trunk movement. Updated HEP with regard to prone extension with progression.    Person(s) Educated Patient   Methods Explanation;Verbal cues   Comprehension Verbalized understanding;Verbal cues required          PT Short Term Goals - 01/18/16 1735      PT SHORT TERM GOAL #1   Title pt will be I with inital HEP as of last visit (01/12/2016)   Time 3   Period Weeks   Status Achieved     PT SHORT TERM GOAL #2    Title pt will be able to verbalize / demo proper techniques to reduce R posterior thigh pain via RICE and HEP (01/12/2016)   Time 3   Period Weeks   Status Achieved           PT Long Term Goals - 03/13/16 1754      PT LONG TERM GOAL #1   Title pt will be I with all HEP given as of last visit (04/24/2016)   Time 6   Period Weeks   Status Revised     PT LONG TERM GOAL #2   Title pt will improve her SLR on the R by >/= -20 degrees with </= 2/10 pain to demonstrate decreased hamstring tighntess (02/02/2016)   Baseline -50 with hip at 90 degrees flexion degrees SLR with dull ache 5/10 pain    Time 6   Period Weeks   Status Revised     PT LONG TERM GOAL #3   Title pt will be able to sit for >/= 45 minutes with </= 2/10 pain to assist with work related requirements (02/02/2016)   Period Weeks   Status Revised     PT LONG TERM GOAL #4   Title pt will be able  jog for >/= 15 minutes with </= 2/10 pain to assist with personal goal of getting back into running and exercise (02/02/2016)   Time 6   Period Weeks   Status Revised     PT LONG TERM GOAL #5   Title pt will improve her LEFS score to >/= 60 points to demonstrate improvement in function at discharge (02/02/2016)   Baseline current score 40   Time 6   Period Weeks   Status Revised               Plan - 04/04/16 1739    Clinical Impression Statement Mrs. 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 she demonstrates limited trunk mobility with significant soreness in the R glute/ piriformis region with referral to the distal RLE, and pinching with extenison. worked on prone extension which she rpeorted mild centralization with continued intermittent referral to the R calf throughout treatment despite remaining in a extended position. continued DN on the piriformis utilizing E-stim to twitch the muscle for fatigue/ relaxation, which she reported significant  relief form 6/10 to a 3/10 with no  radicular symptoms post session.    PT Next Visit Plan assess response to DN, manual/soft tissue work, [place] vs ionto, tack and stretch for piriformis, KT tape,  extension biased program   PT Home Exercise Plan piriformis stretching, prone on elbows and prone press-ups.    Consulted and Agree with Plan of Care Patient      Patient will benefit from skilled therapeutic intervention in order to improve the following deficits and impairments:  Pain, Improper body mechanics, Postural dysfunction, Decreased endurance, Decreased activity tolerance, Decreased balance, Hypomobility, Increased fascial restricitons  Visit Diagnosis: Pain in right thigh  Other abnormalities of gait and mobility  Other muscle spasm  Chronic right-sided low back pain, with sciatica presence unspecified     Problem List Patient Active Problem List   Diagnosis Date Noted  . Right anterior knee pain 04/28/2015   Starr Lake PT, DPT, LAT, ATC  04/04/16  5:48 PM      [place] [facility] [address] [place], [place], [address] Phone: [phone]   Fax:  [phone]  Name: ALIZABETH JAMERSON MRN: [id number] Date of Birth: [birth year]

## 2016-04-06 ENCOUNTER — Ambulatory Visit: Payer: PRIVATE HEALTH INSURANCE | Admitting: Physical Therapy

## 2016-04-06 ENCOUNTER — Other Ambulatory Visit (HOSPITAL_COMMUNITY): Payer: Self-pay | Admitting: Orthopedic Surgery

## 2016-04-06 DIAGNOSIS — M79651 Pain in right thigh: Secondary | ICD-10-CM | POA: Diagnosis not present

## 2016-04-06 DIAGNOSIS — M545 Low back pain: Secondary | ICD-10-CM

## 2016-04-06 DIAGNOSIS — G8929 Other chronic pain: Secondary | ICD-10-CM

## 2016-04-06 DIAGNOSIS — M62838 Other muscle spasm: Secondary | ICD-10-CM

## 2016-04-06 DIAGNOSIS — R2689 Other abnormalities of gait and mobility: Secondary | ICD-10-CM

## 2016-04-06 NOTE — Therapy (Signed)
[name], [place], [address] Phone: [phone]   Fax:  [phone]  Physical Therapy Treatment  Patient Details  Name: Lynn Gardner MRN: [id number] Date of Birth: [birth year] Referring Provider: Odis Luster  Encounter Date: 04/06/2016      PT End of Session - 04/06/16 1244    Visit Number 15   Number of Visits 24   Date for PT Re-Evaluation 05/09/16   Authorization Type [facility]MR/ WC    PT Start Time 1100   PT Stop Time 1148   PT Time Calculation (min) 48 min   Activity Tolerance Patient tolerated treatment well;Treatment limited secondary to medical complications (Comment)   Behavior During Therapy [facility] for tasks assessed/performed      Past Medical History:  Diagnosis Date  . Allergic rhinitis due to pollen   . Allergy   . Essential hypertension, malignant   . Extrinsic asthma, unspecified   . Fibroids   . H/O osteopenia   . Hyperosmolality and/or hypernatremia   . Hypopotassemia   . Palpitations   . Pure hypercholesterolemia   . Vitamin D deficiency     Past Surgical History:  Procedure Laterality Date  . APPENDECTOMY  1972  . TONSILLECTOMY    . WISDOM TOOTH EXTRACTION      There were no vitals filed for this visit.      Subjective Assessment - 04/06/16 1105    Subjective "after last session it was the best sleep i've had in months, been consistent the exercises but tough to tell if it is helping with the numbness"    Currently in Pain? Yes   Pain Score 3    Pain Location Hip   Pain Orientation Right   Pain Descriptors / Indicators Aching;Sore;Pins and needles   Pain Onset 1 to 4 weeks ago   Pain Frequency Constant                         [facility] Adult PT Treatment/Exercise - 04/06/16 0001      Modalities   Modalities Traction     Electrical Stimulation   Electrical Stimulation Location R piriformis   Electrical Stimulation Action Pre-mod set-up  for DN   Electrical Stimulation Parameters CRP set at 15 frequency, intensity set at 2nd solid dot x 10 min   Electrical Stimulation Goals Pain;Other (comment)     Traction   Type of Traction Lumbar   Min (lbs) 50   Max (lbs) 75   Hold Time 60   Rest Time 30   Time 15     Manual Therapy   Manual Therapy Manual Traction   Manual therapy comments tack and stretch of the piriformis   Manual Traction pt reported decreased pain with 3 x 30 sec hold          Trigger Point Dry Needling - 04/06/16 1107    Consent Given? Yes   Education Handout Provided Yes  given previously   Piriformis Response Twitch response elicited;Palpable increased muscle length              PT Education - 04/06/16 1243    Education Details benefits of traction especially if prone extensions are causing peripherlization of symptoms.    Person(s) Educated Patient   Methods Explanation;Verbal cues   Comprehension Verbalized understanding;Verbal cues required          PT Short Term Goals - 01/18/16 1735      PT  SHORT TERM GOAL #1   Title pt will be I with inital HEP as of last visit (01/12/2016)   Time 3   Period Weeks   Status Achieved     PT SHORT TERM GOAL #2   Title pt will be able to verbalize / demo proper techniques to reduce R posterior thigh pain via RICE and HEP (01/12/2016)   Time 3   Period Weeks   Status Achieved           PT Long Term Goals - 03/13/16 1754      PT LONG TERM GOAL #1   Title pt will be I with all HEP given as of last visit (04/24/2016)   Time 6   Period Weeks   Status Revised     PT LONG TERM GOAL #2   Title pt will improve her SLR on the R by >/= -20 degrees with </= 2/10 pain to demonstrate decreased hamstring tighntess (02/02/2016)   Baseline -50 with hip at 90 degrees flexion degrees SLR with dull ache 5/10 pain    Time 6   Period Weeks   Status Revised     PT LONG TERM GOAL #3   Title pt will be able to sit for >/= 45 minutes with </= 2/10 pain  to assist with work related requirements (02/02/2016)   Period Weeks   Status Revised     PT LONG TERM GOAL #4   Title pt will be able  jog for >/= 15 minutes with </= 2/10 pain to assist with personal goal of getting back into running and exercise (02/02/2016)   Time 6   Period Weeks   Status Revised     PT LONG TERM GOAL #5   Title pt will improve her LEFS score to >/= 60 points to demonstrate improvement in function at discharge (02/02/2016)   Baseline current score 40   Time 6   Period Weeks   Status Revised               Plan - 04/06/16 1144    Clinical Impression Statement Mrs. Thurmon reported decreased pain in the hip at 3/10 but conitnues to have referred pain to the R calf. Conitnued DN with E-stim in the R piriformis which she reported no referred pain during and following treatment until resuming prone extension exercises. following manual traction she reported some relief of radicular symptoms, opted to trial mechanical traction due to relief with manual traction today.   studies indicate pt's who demonstrate increased peripheralization with prone extensions would highly benefit from mechanical traction   Rehab Potential Excellent   PT Treatment/Interventions ADLs/Self Care Home Management;Cryotherapy;Electrical Stimulation;Iontophoresis 4mg /ml Dexamethasone;Moist Heat;Therapeutic exercise;Therapeutic activities;Dry needling;Passive range of motion;Manual techniques;Ultrasound;Taping;Traction   PT Next Visit Plan assess response to DN and traction , manual/soft tissue work, [place] vs ionto, tack and stretch for piriformis, KT tape,  extension biased program   PT Home Exercise Plan piriformis stretching, prone on elbows and prone press-ups.    Consulted and Agree with Plan of Care Patient      Patient will benefit from skilled therapeutic intervention in order to improve the following deficits and impairments:  Pain, Improper body mechanics, Postural dysfunction, Decreased  endurance, Decreased activity tolerance, Decreased balance, Hypomobility, Increased fascial restricitons  Visit Diagnosis: Pain in right thigh - Plan: PT plan of care cert/re-cert  Other abnormalities of gait and mobility - Plan: PT plan of care cert/re-cert  Other muscle spasm - Plan: PT plan of care  cert/re-cert  Chronic right-sided low back pain, with sciatica presence unspecified - Plan: PT plan of care cert/re-cert     Problem List Patient Active Problem List   Diagnosis Date Noted  . Right anterior knee pain 04/28/2015   Starr Lake PT, DPT, LAT, ATC  04/06/16  12:50 PM    [facility] [address] [place], [place], [address] Phone: [phone]   Fax:  [phone]  Name: JAYSON MCNEASE MRN: [id number] Date of Birth: 18-Feb-1951

## 2016-04-07 ENCOUNTER — Ambulatory Visit (HOSPITAL_COMMUNITY)
Admission: RE | Admit: 2016-04-07 | Discharge: 2016-04-07 | Disposition: A | Discharge status: Home / Self Care | Payer: PRIVATE HEALTH INSURANCE | Source: Ambulatory Visit | Attending: Orthopedic Surgery | Admitting: Orthopedic Surgery

## 2016-04-07 DIAGNOSIS — Z124 Encounter for screening for malignant neoplasm of cervix: Secondary | ICD-10-CM | POA: Diagnosis not present

## 2016-04-07 DIAGNOSIS — Z1231 Encounter for screening mammogram for malignant neoplasm of breast: Secondary | ICD-10-CM | POA: Diagnosis not present

## 2016-04-07 DIAGNOSIS — M5126 Other intervertebral disc displacement, lumbar region: Secondary | ICD-10-CM | POA: Insufficient documentation

## 2016-04-07 DIAGNOSIS — Z6826 Body mass index (BMI) 26.0-26.9, adult: Secondary | ICD-10-CM | POA: Diagnosis not present

## 2016-04-07 DIAGNOSIS — Z01419 Encounter for gynecological examination (general) (routine) without abnormal findings: Secondary | ICD-10-CM | POA: Diagnosis not present

## 2016-04-07 DIAGNOSIS — M545 Low back pain: Secondary | ICD-10-CM | POA: Diagnosis present

## 2016-04-07 MED FILL — RECTASMOOTHE 5% CREAM: 5 | 20 days supply | Qty: 30 | Fill #0

## 2016-04-07 MED FILL — ESTRACE 0.01% CREAM: 0.1 | 90 days supply | Qty: 43 | Fill #0

## 2016-04-08 ENCOUNTER — Ambulatory Visit (HOSPITAL_COMMUNITY): Admission: RE | Admit: 2016-04-08 | Payer: PRIVATE HEALTH INSURANCE | Source: Ambulatory Visit

## 2016-04-11 ENCOUNTER — Ambulatory Visit: Payer: PRIVATE HEALTH INSURANCE | Admitting: Physical Therapy

## 2016-04-11 DIAGNOSIS — G8929 Other chronic pain: Secondary | ICD-10-CM

## 2016-04-11 DIAGNOSIS — M62838 Other muscle spasm: Secondary | ICD-10-CM

## 2016-04-11 DIAGNOSIS — M545 Low back pain: Secondary | ICD-10-CM

## 2016-04-11 DIAGNOSIS — M79651 Pain in right thigh: Secondary | ICD-10-CM

## 2016-04-11 DIAGNOSIS — R2689 Other abnormalities of gait and mobility: Secondary | ICD-10-CM

## 2016-04-11 NOTE — Patient Instructions (Signed)

## 2016-04-11 NOTE — Therapy (Signed)
[place], [place], [address] Phone: [phone]   Fax:  [phone]  Physical Therapy Treatment  Patient Details  Name: Lynn Gardner MRN: [id number] Date of Birth: 14-Sep-1950 Referring Provider: Odis Gardner  Encounter Date: 04/11/2016      PT End of Session - 04/11/16 1716    Visit Number 16   Number of Visits 24   Date for PT Re-Evaluation 05/09/16   PT Start Time 1630   PT Stop Time 1723   PT Time Calculation (min) 53 min   Activity Tolerance Patient tolerated treatment well   Behavior During Therapy [facility] for tasks assessed/performed      Past Medical History:  Diagnosis Date  . Allergic rhinitis due to pollen   . Allergy   . Essential hypertension, malignant   . Extrinsic asthma, unspecified   . Fibroids   . H/O osteopenia   . Hyperosmolality and/or hypernatremia   . Hypopotassemia   . Palpitations   . Pure hypercholesterolemia   . Vitamin D deficiency     Past Surgical History:  Procedure Laterality Date  . APPENDECTOMY  1972  . TONSILLECTOMY    . WISDOM TOOTH EXTRACTION      There were no vitals filed for this visit.      Subjective Assessment - 04/11/16 1629    Subjective "The traction helped until later that night I did get the pain in the thigh, exercise do help with reducing pain, but continuing to have pain with referral to the buttock with some fluctuating numbness in the R ankle"   Currently in Pain? Yes   Pain Score 4    Pain Location Hip   Pain Orientation Right   Pain Descriptors / Indicators Aching;Pins and needles   Pain Score 0   Pain Location Back                         [facility] Adult PT Treatment/Exercise - 04/11/16 0001      Lumbar Exercises: Stretches   Piriformis Stretch 3 reps;30 seconds     Lumbar Exercises: Standing   Other Standing Lumbar Exercises repeated trunk extension in standing 10 x 10 reps  gradual centralization      Lumbar Exercises: Supine   Bridge 10 reps;1 second  x 2 sets     Traction   Type of Traction Lumbar   Min (lbs) 70   Max (lbs) 85   Hold Time 60   Rest Time 30   Time 15                PT Education - 04/11/16 1737    Education provided Yes   Education Details posture / lifting/ carrying Astronomer. updated HEP for piriformis stretching and bridges. to avoid forward trunk bending motions, and if any is performed to follow it up with standing trunk extensions   Person(s) Educated Patient   Methods Explanation;Verbal cues;Handout   Comprehension Verbalized understanding;Verbal cues required          PT Short Term Goals - 01/18/16 1735      PT SHORT TERM GOAL #1   Title pt will be I with inital HEP as of last visit (01/12/2016)   Time 3   Period Weeks   Status Achieved     PT SHORT TERM GOAL #2   Title pt will be able to verbalize / demo proper techniques to reduce R posterior thigh pain via  RICE and HEP (01/12/2016)   Time 3   Period Weeks   Status Achieved           PT Long Term Goals - 04/11/16 1742      PT LONG TERM GOAL #1   Title pt will be I with all HEP given as of last visit (04/24/2016)   Time 6   Period Weeks   Status On-going     PT LONG TERM GOAL #2   Title pt will improve her SLR on the R by >/= -20 degrees with </= 2/10 pain to demonstrate decreased hamstring tighntess (02/02/2016)   Time 6   Period Weeks   Status On-going     PT LONG TERM GOAL #3   Title pt will be able to sit for >/= 45 minutes with </= 2/10 pain to assist with work related requirements (02/02/2016)   Time 6   Period Weeks   Status On-going     PT LONG TERM GOAL #4   Title pt will be able  jog for >/= 15 minutes with </= 2/10 pain to assist with personal goal of getting back into running and exercise (02/02/2016)   Time 6   Period Weeks   Status On-going     PT LONG TERM GOAL #5   Title pt will improve her LEFS score to >/= 60 points to demonstrate  improvement in function at discharge (02/02/2016)   Time 6   Period Weeks   Status On-going               Plan - 04/11/16 1739    Clinical Impression Statement Lynn Gardner continues to demonstrate improvement with report of 4/10 pain in the back with only intermittent referral to the R calf. continued mechanical traction, followed with repeated trunk extension progression to standing with noticeable centralization to the low back. she was able to perform exercises and stretches given and reported pain dropped to 2/10 post session.    PT Next Visit Plan assess response to traction , manual/soft tissue work, [place] vs ionto, tack and stretch for piriformis, KT tape,  extension biased program,    PT Home Exercise Plan piriformis stretching, prone on elbows and prone press-ups, standing extension, posture education, piriformis stretching, bridges   Consulted and Agree with Plan of Care Patient      Patient will benefit from skilled therapeutic intervention in order to improve the following deficits and impairments:  Pain, Improper body mechanics, Postural dysfunction, Decreased endurance, Decreased activity tolerance, Decreased balance, Hypomobility, Increased fascial restricitons  Visit Diagnosis: Pain in right thigh  Other abnormalities of gait and mobility  Other muscle spasm  Chronic right-sided low back pain, with sciatica presence unspecified     Problem List Patient Active Problem List   Diagnosis Date Noted  . Right anterior knee pain 04/28/2015   Lynn Gardner PT, DPT, LAT, ATC  04/11/16  5:44 PM      [place] [facility] [address] [place], [place], [address] Phone: [phone]   Fax:  [phone]  Name: Lynn Gardner MRN: [id number] Date of Birth: [birth year]

## 2016-04-13 ENCOUNTER — Ambulatory Visit: Payer: PRIVATE HEALTH INSURANCE | Attending: Internal Medicine | Admitting: Physical Therapy

## 2016-04-13 DIAGNOSIS — G8929 Other chronic pain: Secondary | ICD-10-CM | POA: Diagnosis present

## 2016-04-13 DIAGNOSIS — M79651 Pain in right thigh: Secondary | ICD-10-CM | POA: Diagnosis not present

## 2016-04-13 DIAGNOSIS — R2689 Other abnormalities of gait and mobility: Secondary | ICD-10-CM | POA: Diagnosis present

## 2016-04-13 DIAGNOSIS — M545 Low back pain: Secondary | ICD-10-CM | POA: Diagnosis present

## 2016-04-13 DIAGNOSIS — M62838 Other muscle spasm: Secondary | ICD-10-CM | POA: Diagnosis present

## 2016-04-13 NOTE — Therapy (Signed)
[place], [place], [address] Phone: [phone]   Fax:  [phone]  Physical Therapy Treatment  Patient Details  Name: Lynn Gardner MRN: [id number] Date of Birth: [birth year] Referring Provider: Odis Luster  Encounter Date: 04/13/2016      PT End of Session - 04/13/16 1717    Visit Number 17   Number of Visits 24   Date for PT Re-Evaluation 05/09/16   Authorization Type [facility] UMR/ WC    PT Start Time 1631   PT Stop Time 1720   PT Time Calculation (min) 49 min   Activity Tolerance Patient tolerated treatment well   Behavior During Therapy [facility] for tasks assessed/performed      Past Medical History:  Diagnosis Date  . Allergic rhinitis due to pollen   . Allergy   . Essential hypertension, malignant   . Extrinsic asthma, unspecified   . Fibroids   . H/O osteopenia   . Hyperosmolality and/or hypernatremia   . Hypopotassemia   . Palpitations   . Pure hypercholesterolemia   . Vitamin D deficiency     Past Surgical History:  Procedure Laterality Date  . APPENDECTOMY  1972  . TONSILLECTOMY    . WISDOM TOOTH EXTRACTION      There were no vitals filed for this visit.      Subjective Assessment - 04/13/16 1636    Subjective "I believed the traction helped and I am have been doing the extension exercises as much as possible" I did see MD and he referred me to get an injection in the back"   Currently in Pain? Yes   Pain Score 4    Pain Onset More than a month ago   Pain Frequency Intermittent   Aggravating Factors  Driving, prolonged sitting.    Pain Relieving Factors heating pad,                          [facility] Adult PT Treatment/Exercise - 04/13/16 0001      Lumbar Exercises: Standing   Other Standing Lumbar Exercises repeated trunk extension in standing 5 x 10 reps     Traction   Type of Traction Lumbar   Min (lbs) 70   Max (lbs) 85   Hold Time 60   Rest  Time 30   Time 15     Manual Therapy   Manual therapy comments tack and stretch of the piriformis   Myofascial Release fascial stretcing over lumbar paraspinals on the right          Trigger Point Dry Needling - 04/13/16 1715    Consent Given? Yes   Education Handout Provided Yes   Muscles Treated Upper Body Longissimus   Longissimus Response Twitch response elicited;Palpable increased muscle length  multifidus on the R at L5-L3    Piriformis Response Twitch response elicited;Palpable increased muscle length              PT Education - 04/13/16 1729    Education provided Yes   Education Details if she gets an injection in the lumbar spine and has physical therapy within 24 hour after to cancel and reschedule that visit to a later date, in order to let the medication do its job.    Person(s) Educated Patient   Methods Explanation;Verbal cues   Comprehension Verbalized understanding;Verbal cues required          PT Short Term Goals - 01/18/16 1735  PT SHORT TERM GOAL #1   Title pt will be I with inital HEP as of last visit (01/12/2016)   Time 3   Period Weeks   Status Achieved     PT SHORT TERM GOAL #2   Title pt will be able to verbalize / demo proper techniques to reduce R posterior thigh pain via RICE and HEP (01/12/2016)   Time 3   Period Weeks   Status Achieved           PT Long Term Goals - 04/11/16 1742      PT LONG TERM GOAL #1   Title pt will be I with all HEP given as of last visit (04/24/2016)   Time 6   Period Weeks   Status On-going     PT LONG TERM GOAL #2   Title pt will improve her SLR on the R by >/= -20 degrees with </= 2/10 pain to demonstrate decreased hamstring tighntess (02/02/2016)   Time 6   Period Weeks   Status On-going     PT LONG TERM GOAL #3   Title pt will be able to sit for >/= 45 minutes with </= 2/10 pain to assist with work related requirements (02/02/2016)   Time 6   Period Weeks   Status On-going     PT LONG  TERM GOAL #4   Title pt will be able  jog for >/= 15 minutes with </= 2/10 pain to assist with personal goal of getting back into running and exercise (02/02/2016)   Time 6   Period Weeks   Status On-going     PT LONG TERM GOAL #5   Title pt will improve her LEFS score to >/= 60 points to demonstrate improvement in function at discharge (02/02/2016)   Time 6   Period Weeks   Status On-going               Plan - 04/13/16 1725    Clinical Impression Statement continued to utilize mechanical traction prior to treatment which pt reports centralization. Performed DN on the piriformis and R lumbar multifidus which she reported decreased tightness and radicular symptoms following stretching and fascial release techniques.    PT Next Visit Plan continue lumbar traction PRN, manual/soft tissue work, [place] vs ionto, tack and stretch for piriformis, KT tape,  extension biased program,       Patient will benefit from skilled therapeutic intervention in order to improve the following deficits and impairments:  Pain, Improper body mechanics, Postural dysfunction, Decreased endurance, Decreased activity tolerance, Decreased balance, Hypomobility, Increased fascial restricitons  Visit Diagnosis: Pain in right thigh  Other abnormalities of gait and mobility  Other muscle spasm  Chronic right-sided low back pain, with sciatica presence unspecified     Problem List Patient Active Problem List   Diagnosis Date Noted  . Right anterior knee pain 04/28/2015   Starr Lake PT, DPT, LAT, ATC  04/13/16  5:31 PM      [place] [facility] [address] [place], [place], [address] Phone: [phone]   Fax:  [phone]  Name: Lynn Gardner MRN: [id number] Date of Birth: September 12, 1950

## 2016-04-18 ENCOUNTER — Ambulatory Visit: Payer: PRIVATE HEALTH INSURANCE | Admitting: Physical Therapy

## 2016-04-18 DIAGNOSIS — R2689 Other abnormalities of gait and mobility: Secondary | ICD-10-CM

## 2016-04-18 DIAGNOSIS — G8929 Other chronic pain: Secondary | ICD-10-CM

## 2016-04-18 DIAGNOSIS — M79651 Pain in right thigh: Secondary | ICD-10-CM | POA: Diagnosis not present

## 2016-04-18 DIAGNOSIS — M62838 Other muscle spasm: Secondary | ICD-10-CM

## 2016-04-18 DIAGNOSIS — M545 Low back pain: Secondary | ICD-10-CM

## 2016-04-18 NOTE — Therapy (Signed)
[place], [place], [address] Phone: [phone]   Fax:  [phone]  Physical Therapy Treatment  Patient Details  Name: Lynn Gardner MRN: [id number] Date of Birth: 18-Mar-1951 Referring Provider: Odis Luster  Encounter Date: 04/18/2016      PT End of Session - 04/18/16 1731    Visit Number 18   Number of Visits 24   Date for PT Re-Evaluation 05/09/16   PT Start Time 1632   PT Stop Time 1720   PT Time Calculation (min) 48 min   Activity Tolerance Patient tolerated treatment well   Behavior During Therapy [facility] for tasks assessed/performed      Past Medical History:  Diagnosis Date  . Allergic rhinitis due to pollen   . Allergy   . Essential hypertension, malignant   . Extrinsic asthma, unspecified   . Fibroids   . H/O osteopenia   . Hyperosmolality and/or hypernatremia   . Hypopotassemia   . Palpitations   . Pure hypercholesterolemia   . Vitamin D deficiency     Past Surgical History:  Procedure Laterality Date  . APPENDECTOMY  1972  . TONSILLECTOMY    . WISDOM TOOTH EXTRACTION      There were no vitals filed for this visit.      Subjective Assessment - 04/18/16 1637    Subjective "I was alittle sore  after the last session but over the next couple days it got better"    Currently in Pain? Yes   Pain Score 3    Pain Location Hip   Pain Orientation Right   Pain Descriptors / Indicators Aching;Pins and needles   Pain Type Chronic pain   Pain Onset More than a month ago   Pain Frequency Intermittent                         [facility] Adult PT Treatment/Exercise - 04/18/16 0001      Lumbar Exercises: Stretches   Press Ups 2 reps;30 seconds  2 x 30 weight shifted to the R   Piriformis Stretch 3 reps;30 seconds  in sitting     Traction   Min (lbs) 80   Max (lbs) 90   Hold Time 60   Rest Time 30   Time 15     Manual Therapy   Manual Therapy Other  (comment)   Manual therapy comments tack and stretch of the piriformis   Myofascial Release fascial stretcing over lumbar paraspinals on the right   Other Manual Therapy lateral shift correction with extension 2 x 30   reported centraliztion low back                PT Education - 04/18/16 1730    Education provided Yes   Education Details performing prone on elbows/ press-ups keeping legs and upper body to the R to work on lateral shifting to mimic todays treatment   Person(s) Educated Patient   Methods Explanation;Verbal cues   Comprehension Verbalized understanding;Verbal cues required          PT Short Term Goals - 01/18/16 1735      PT SHORT TERM GOAL #1   Title pt will be I with inital HEP as of last visit (01/12/2016)   Time 3   Period Weeks   Status Achieved     PT SHORT TERM GOAL #2   Title pt will be able to verbalize / demo proper techniques to reduce  R posterior thigh pain via RICE and HEP (01/12/2016)   Time 3   Period Weeks   Status Achieved           PT Long Term Goals - 04/11/16 1742      PT LONG TERM GOAL #1   Title pt will be I with all HEP given as of last visit (04/24/2016)   Time 6   Period Weeks   Status On-going     PT LONG TERM GOAL #2   Title pt will improve her SLR on the R by >/= -20 degrees with </= 2/10 pain to demonstrate decreased hamstring tighntess (02/02/2016)   Time 6   Period Weeks   Status On-going     PT LONG TERM GOAL #3   Title pt will be able to sit for >/= 45 minutes with </= 2/10 pain to assist with work related requirements (02/02/2016)   Time 6   Period Weeks   Status On-going     PT LONG TERM GOAL #4   Title pt will be able  jog for >/= 15 minutes with </= 2/10 pain to assist with personal goal of getting back into running and exercise (02/02/2016)   Time 6   Period Weeks   Status On-going     PT LONG TERM GOAL #5   Title pt will improve her LEFS score to >/= 60 points to demonstrate improvement in function  at discharge (02/02/2016)   Time 6   Period Weeks   Status On-going               Plan - 04/18/16 1732    Clinical Impression Statement [name] reports some relief of pain to 3/10 and has been consistent with her HEP. continued lumbar traction followed with piriformis stretching and prone press ups utilizing R shift to address possible posterolateral shift. she reported increased centralization to the low back, progressed to standing with manual assist with lateral shift and multiple extensions which she centralized to the low back completely.    PT Next Visit Plan continue lumbar traction PRN, manual/soft tissue work, [place] vs ionto, tack and stretch for piriformis, KT tape,  extension biased program,    PT Home Exercise Plan piriformis stretching, prone on elbows and prone press-ups, standing extension, posture education, piriformis stretching, bridges   Consulted and Agree with Plan of Care Patient      Patient will benefit from skilled therapeutic intervention in order to improve the following deficits and impairments:  Pain, Improper body mechanics, Postural dysfunction, Decreased endurance, Decreased activity tolerance, Decreased balance, Hypomobility, Increased fascial restricitons  Visit Diagnosis: Pain in right thigh  Other abnormalities of gait and mobility  Other muscle spasm  Chronic right-sided low back pain, with sciatica presence unspecified     Problem List Patient Active Problem List   Diagnosis Date Noted  . Right anterior knee pain 04/28/2015   Starr Lake PT, DPT, LAT, ATC  04/18/16  5:35 PM      [place], [place], [address] Phone: [phone]   Fax:  [phone]  Name: Lynn Gardner MRN: [id number] Date of Birth: [birth year]

## 2016-04-20 ENCOUNTER — Ambulatory Visit: Payer: PRIVATE HEALTH INSURANCE | Admitting: Physical Therapy

## 2016-04-20 DIAGNOSIS — M79651 Pain in right thigh: Secondary | ICD-10-CM | POA: Diagnosis not present

## 2016-04-20 DIAGNOSIS — G8929 Other chronic pain: Secondary | ICD-10-CM

## 2016-04-20 DIAGNOSIS — M545 Low back pain: Secondary | ICD-10-CM

## 2016-04-20 DIAGNOSIS — M62838 Other muscle spasm: Secondary | ICD-10-CM

## 2016-04-20 DIAGNOSIS — R2689 Other abnormalities of gait and mobility: Secondary | ICD-10-CM

## 2016-04-20 NOTE — Therapy (Signed)
[place], [place], [address] Phone: [phone]   Fax:  [phone]  Physical Therapy Treatment  Patient Details  Name: Lynn Gardner MRN: [id number] Date of Birth: 01-12-1951 Referring Provider: Odis Luster  Encounter Date: 04/20/2016      PT End of Session - 04/20/16 1646    Visit Number 19   Number of Visits 24   Date for PT Re-Evaluation 05/09/16   PT Start Time 1630   PT Stop Time 1718   PT Time Calculation (min) 48 min   Activity Tolerance Patient tolerated treatment well   Behavior During Therapy [facility] for tasks assessed/performed      Past Medical History:  Diagnosis Date  . Allergic rhinitis due to pollen   . Allergy   . Essential hypertension, malignant   . Extrinsic asthma, unspecified   . Fibroids   . H/O osteopenia   . Hyperosmolality and/or hypernatremia   . Hypopotassemia   . Palpitations   . Pure hypercholesterolemia   . Vitamin D deficiency     Past Surgical History:  Procedure Laterality Date  . APPENDECTOMY  1972  . TONSILLECTOMY    . WISDOM TOOTH EXTRACTION      There were no vitals filed for this visit.      Subjective Assessment - 04/20/16 1630    Subjective "Today was the best day i've had, I would rate my pain at a 3/10 only because of the soreness in my hip"    Currently in Pain? Yes   Pain Score 3    Pain Orientation Right   Pain Descriptors / Indicators Aching;Burning;Sore   Pain Type Chronic pain   Pain Frequency Intermittent   Aggravating Factors  sitting/ driving,    Pain Relieving Factors heating pad, exercises, stretching    Pain Score 0   Pain Location Back                         [facility] Adult PT Treatment/Exercise - 04/20/16 1636      Lumbar Exercises: Stretches   Active Hamstring Stretch 3 reps;30 seconds  contract/ relax with 10 sec contraction   Piriformis Stretch 3 reps;30 seconds  contract/ relax with 10 sec  contraction     Lumbar Exercises: Supine   Bridge 10 reps;2 seconds  with glute contraction x 2 sets     Knee/Hip Exercises: Aerobic   Nustep Nu-Step L 5 x 6 min     Knee/Hip Exercises: Standing   Other Standing Knee Exercises shifting hips to L on wall and extending 6 x 25   reported centralization and abolishing pain     Manual Therapy   Manual Therapy Neural Stretch   Manual therapy comments manual trigger point release of piriformis 2 x   had pt sit on tennis ball for added trigger point release   Other Manual Therapy lateral shift correction with extension 2 x 30    Neural Stretch sciatic nerve stretching 2 x 20                PT Education - 04/20/16 1725    Education provided Yes   Education Details peforming standing L lateral hip shift exercise putting hips on the wall performing multiple reps. using tennis ball to perform trigger point release on piriformis   Person(s) Educated Patient   Methods Explanation;Verbal cues   Comprehension Verbalized understanding;Verbal cues required  PT Short Term Goals - 01/18/16 1735      PT SHORT TERM GOAL #1   Title pt will be I with inital HEP as of last visit (01/12/2016)   Time 3   Period Weeks   Status Achieved     PT SHORT TERM GOAL #2   Title pt will be able to verbalize / demo proper techniques to reduce R posterior thigh pain via RICE and HEP (01/12/2016)   Time 3   Period Weeks   Status Achieved           PT Long Term Goals - 04/11/16 1742      PT LONG TERM GOAL #1   Title pt will be I with all HEP given as of last visit (04/24/2016)   Time 6   Period Weeks   Status On-going     PT LONG TERM GOAL #2   Title pt will improve her SLR on the R by >/= -20 degrees with </= 2/10 pain to demonstrate decreased hamstring tighntess (02/02/2016)   Time 6   Period Weeks   Status On-going     PT LONG TERM GOAL #3   Title pt will be able to sit for >/= 45 minutes with </= 2/10 pain to assist with work  related requirements (02/02/2016)   Time 6   Period Weeks   Status On-going     PT LONG TERM GOAL #4   Title pt will be able  jog for >/= 15 minutes with </= 2/10 pain to assist with personal goal of getting back into running and exercise (02/02/2016)   Time 6   Period Weeks   Status On-going     PT LONG TERM GOAL #5   Title pt will improve her LEFS score to >/= 60 points to demonstrate improvement in function at discharge (02/02/2016)   Time 6   Period Weeks   Status On-going               Plan - 04/20/16 1728    Clinical Impression Statement Lynn Gardner reported today is the best day she has had, continued soreness in the piriformis region. Following manual trigger point release of the piriformis she reported decresaed soreness, continued lateral shift with multiple reps of extensions which she continues to report centralization to the low back with gradual abolishment of pain to 1/10 until she stands up straight and shifts her weight back on to both legs and has soreness back in the piriformis region on the R. she declined modalities today.    PT Next Visit Plan continue lumbar traction PRN, manual/soft tissue work, [place] vs ionto, tack and stretch for piriformis, KT tape,  extension biased program, DN of the piriformis with E-stim   PT Home Exercise Plan piriformis stretching, prone on elbows and prone press-ups, standing extension, posture education, piriformis stretching, bridges, lateral shift extensions, tennis ball manual trigger point release of piriformis   Consulted and Agree with Plan of Care Patient      Patient will benefit from skilled therapeutic intervention in order to improve the following deficits and impairments:  Pain, Improper body mechanics, Postural dysfunction, Decreased endurance, Decreased activity tolerance, Decreased balance, Hypomobility, Increased fascial restricitons  Visit Diagnosis: Pain in right thigh  Other abnormalities of gait and  mobility  Other muscle spasm  Chronic right-sided low back pain, with sciatica presence unspecified     Problem List Patient Active Problem List   Diagnosis Date Noted  . Right anterior knee  pain 04/28/2015   Starr Lake PT, DPT, LAT, ATC  04/20/16  5:32 PM      [facility] [address] [place], [place], [address] Phone: [phone]   Fax:  [phone]  Name: Lynn Gardner MRN: [id number] Date of Birth: 06/05/1951

## 2016-05-02 ENCOUNTER — Ambulatory Visit: Payer: PRIVATE HEALTH INSURANCE | Admitting: Physical Therapy

## 2016-05-02 DIAGNOSIS — M545 Low back pain: Secondary | ICD-10-CM

## 2016-05-02 DIAGNOSIS — G8929 Other chronic pain: Secondary | ICD-10-CM

## 2016-05-02 DIAGNOSIS — R2689 Other abnormalities of gait and mobility: Secondary | ICD-10-CM

## 2016-05-02 DIAGNOSIS — M79651 Pain in right thigh: Secondary | ICD-10-CM

## 2016-05-02 DIAGNOSIS — M62838 Other muscle spasm: Secondary | ICD-10-CM

## 2016-05-02 NOTE — Therapy (Signed)
[place], [place], [address] Phone: [phone]   Fax:  [phone]  Physical Therapy Treatment  Patient Details  Name: Lynn Gardner MRN: [id number] Date of Birth: Jan 05, 1951 Referring Provider: Odis Luster  Encounter Date: 05/02/2016      PT End of Session - 05/02/16 1744    Visit Number 20   Number of Visits 24   Date for PT Re-Evaluation 05/09/16   Authorization Type [facility] U[facility]/ WC    PT Start Time 1416   PT Stop Time 1500   PT Time Calculation (min) 44 min   Activity Tolerance Patient tolerated treatment well   Behavior During Therapy [facility] for tasks assessed/performed      Past Medical History:  Diagnosis Date  . Allergic rhinitis due to pollen   . Allergy   . Essential hypertension, malignant   . Extrinsic asthma, unspecified   . Fibroids   . H/O osteopenia   . Hyperosmolality and/or hypernatremia   . Hypopotassemia   . Palpitations   . Pure hypercholesterolemia   . Vitamin D deficiency     Past Surgical History:  Procedure Laterality Date  . APPENDECTOMY  1972  . TONSILLECTOMY    . WISDOM TOOTH EXTRACTION      There were no vitals filed for this visit.      Subjective Assessment - 05/02/16 1416    Subjective "I've had a good week, and am feeling pretty good"    Currently in Pain? Yes   Pain Onset More than a month ago   Aggravating Factors  prolonged sitting   Pain Relieving Factors extensions, stretching    Pain Score 3   Pain Location Back   Pain Orientation Right   Pain Descriptors / Indicators Aching   Pain Type Chronic pain   Pain Onset More than a month ago   Pain Frequency Intermittent   Aggravating Factors  prolong sitting/ driving   Pain Relieving Factors extension                         [facility] Adult PT Treatment/Exercise - 05/02/16 1435      Lumbar Exercises: Stretches   Active Hamstring Stretch 3 reps;30 seconds   Standing  Extension Other (comment)   Standing Extension Limitations performed in sitting extending over bolster  3 x 15   to promote proper posture in sitting   Press Ups 2 reps;60 seconds   Piriformis Stretch 3 reps;30 seconds  performed intermittently throughout seated extension      Knee/Hip Exercises: Aerobic   Nustep Nu-Step L 5 x 6 min  LE only, with rolled up towel in lumbar spine     Electrical Stimulation   Electrical Stimulation Location R piriformis   Electrical Stimulation Action Pre-mod E-stim set up for DN   Electrical Stimulation Parameters CRP frequency at 13, intensity at sec solid dot, x 10 min   Electrical Stimulation Goals Pain;Other (comment)  fatigue     Manual Therapy   Manual therapy comments tack and stretch over piriformis 10 x 5 sec hold   Myofascial Release fascial stretcing over lumbar paraspinals on the right          Trigger Point Dry Needling - 05/02/16 1433    Consent Given? Yes   Education Handout Provided Yes  Given previously   Longissimus Response Twitch response elicited;Palpable increased muscle length  mulitifidus at L5-L2 on the R   Piriformis Response Twitch  response elicited;Palpable increased muscle length  with e-stim              PT Education - 05/02/16 1743    Education provided Yes   Education Details seated posture to promote spinal curvature and assist with extension in sitting.    Person(s) Educated Patient   Methods Explanation;Verbal cues;Handout;Demonstration   Comprehension Verbalized understanding;Verbal cues required;Returned demonstration          PT Short Term Goals - 01/18/16 1735      PT SHORT TERM GOAL #1   Title pt will be I with inital HEP as of last visit (01/12/2016)   Time 3   Period Weeks   Status Achieved     PT SHORT TERM GOAL #2   Title pt will be able to verbalize / demo proper techniques to reduce R posterior thigh pain via RICE and HEP (01/12/2016)   Time 3   Period Weeks   Status Achieved            PT Long Term Goals - 04/11/16 1742      PT LONG TERM GOAL #1   Title pt will be I with all HEP given as of last visit (04/24/2016)   Time 6   Period Weeks   Status On-going     PT LONG TERM GOAL #2   Title pt will improve her SLR on the R by >/= -20 degrees with </= 2/10 pain to demonstrate decreased hamstring tighntess (02/02/2016)   Time 6   Period Weeks   Status On-going     PT LONG TERM GOAL #3   Title pt will be able to sit for >/= 45 minutes with </= 2/10 pain to assist with work related requirements (02/02/2016)   Time 6   Period Weeks   Status On-going     PT LONG TERM GOAL #4   Title pt will be able  jog for >/= 15 minutes with </= 2/10 pain to assist with personal goal of getting back into running and exercise (02/02/2016)   Time 6   Period Weeks   Status On-going     PT LONG TERM GOAL #5   Title pt will improve her LEFS score to >/= 60 points to demonstrate improvement in function at discharge (02/02/2016)   Time 6   Period Weeks   Status On-going               Plan - 05/02/16 1744    Clinical Impression Statement Mrs Line continues to progress with treatment reporting last weeking being her best week. continued DN over the priformis and multifidus on the R Lumbar region. conintued extenin in sitting utilizing bolster to promote posture in sitting and assist with repeated extension. post session she reported pain in the hip and low back as 1/10.    PT Next Visit Plan work toward extension based trunk exercises, lumbar traction PRN, manual/soft tissue work, [place] vs ionto, tack and stretch for piriformis, KT tape,  extension biased program, DN of the piriformis with E-stim   PT Home Exercise Plan piriformis stretching, prone on elbows and prone press-ups, standing extension, posture education, piriformis stretching, bridges, lateral shift extensions, tennis ball manual trigger point release of piriformis, extension over bolster   Consulted and  Agree with Plan of Care Patient      Patient will benefit from skilled therapeutic intervention in order to improve the following deficits and impairments:  Pain, Improper body mechanics, Postural dysfunction, Decreased endurance, Decreased  activity tolerance, Decreased balance, Hypomobility, Increased fascial restricitons  Visit Diagnosis: Pain in right thigh  Other abnormalities of gait and mobility  Other muscle spasm  Chronic right-sided low back pain, with sciatica presence unspecified     Problem List Patient Active Problem List   Diagnosis Date Noted  . Right anterior knee pain 04/28/2015   Starr Lake PT, DPT, LAT, ATC  05/02/16  5:47 PM      [place] [facility] [address] [place], [place], [address] Phone: [phone]   Fax:  [phone]  Name: Lynn Gardner MRN: [id number] Date of Birth: 07-02-1950

## 2016-05-02 NOTE — Patient Instructions (Signed)

## 2016-05-08 ENCOUNTER — Ambulatory Visit: Payer: PRIVATE HEALTH INSURANCE | Admitting: Physical Therapy

## 2016-05-10 ENCOUNTER — Ambulatory Visit: Payer: PRIVATE HEALTH INSURANCE | Admitting: Physical Therapy

## 2016-05-15 ENCOUNTER — Ambulatory Visit: Payer: PRIVATE HEALTH INSURANCE | Attending: Internal Medicine | Admitting: Physical Therapy

## 2016-05-15 DIAGNOSIS — M62838 Other muscle spasm: Secondary | ICD-10-CM | POA: Insufficient documentation

## 2016-05-15 DIAGNOSIS — R2689 Other abnormalities of gait and mobility: Secondary | ICD-10-CM | POA: Insufficient documentation

## 2016-05-15 DIAGNOSIS — M79651 Pain in right thigh: Secondary | ICD-10-CM | POA: Insufficient documentation

## 2016-05-15 DIAGNOSIS — G8929 Other chronic pain: Secondary | ICD-10-CM | POA: Diagnosis present

## 2016-05-15 DIAGNOSIS — M545 Low back pain: Secondary | ICD-10-CM | POA: Insufficient documentation

## 2016-05-15 NOTE — Therapy (Deleted)
[place], [place], [address] Phone: [phone]   Fax:  [phone]  Physical Therapy Evaluation  Patient Details  Name: Lynn Gardner MRN: [id number] Date of Birth: [birth year] Referring Provider: Odis Gardner  Encounter Date: 05/15/2016      PT End of Session - 05/15/16 1558    Visit Number 22   Number of Visits 28   Date for PT Re-Evaluation 06/19/16   Authorization Type [facility] UMR/ WC    PT Start Time 1550   PT Stop Time 6606   PT Time Calculation (min) 41 min   Activity Tolerance Patient tolerated treatment well   Behavior During Therapy [facility] for tasks assessed/performed      Past Medical History:  Diagnosis Date  . Allergic rhinitis due to pollen   . Allergy   . Essential hypertension, malignant   . Extrinsic asthma, unspecified   . Fibroids   . H/O osteopenia   . Hyperosmolality and/or hypernatremia   . Hypopotassemia   . Palpitations   . Pure hypercholesterolemia   . Vitamin D deficiency     Past Surgical History:  Procedure Laterality Date  . APPENDECTOMY  1972  . TONSILLECTOMY    . WISDOM TOOTH EXTRACTION      There were no vitals filed for this visit.       Subjective Assessment - 05/15/16 1554    Subjective "I was sore over the weekend of Thanksgiving, I am having some spasms still in the low back" pt reports she is getting a shot in her back on the 12th.    Currently in Pain? Yes   Pain Score 2    Pain Location Hip   Pain Orientation Right   Pain Frequency Intermittent   Aggravating Factors  prolonged sitting, driving to work   Pain Score 2   Pain Orientation Right   Pain Frequency Intermittent   Aggravating Factors  prolong sitting/ driving to work   Pain Relieving Factors extension             [facility] PT Assessment - 05/15/16 0001      Observation/Other Assessments   Lower Extremity Functional Scale  54/80     AROM   Lumbar Flexion 70  small pinch  in the glute at end range flexion   Lumbar Extension 22  relief of pain    Lumbar - Right Side Bend 22  pulling in the glute at end range   Lumbar - Left Side Bend 22     Flexibility   Hamstrings R hamstring able to get 65 degrees with knee straight   with no pain                    [facility] Adult PT Treatment/Exercise - 05/15/16 0001      Lumbar Exercises: Stretches   Active Hamstring Stretch 3 reps;30 seconds   Press Ups --  2 sets x 20      Manual Therapy   Manual Therapy Neural Stretch   Manual therapy comments tack and stretch over piriformis 10 x 5 sec hold   Soft tissue mobilization IASTM of R mid hamstring   Myofascial Release fascial stretcing over R hamstring    Neural Stretch sciatic nerve stretching 3 x 20 in supine with, also in sitting 2 x 20  reported some relief of pain and tightness                PT Education -  05/15/16 1632    Education provided Yes   Education Details updated HEP for nueral glides/ flossing   Person(s) Educated Patient   Methods Explanation;Verbal cues;Handout   Comprehension Verbalized understanding;Verbal cues required          PT Short Term Goals - 01/18/16 1735      PT SHORT TERM GOAL #1   Title pt will be I with inital HEP as of last visit (01/12/2016)   Time 3   Period Weeks   Status Achieved     PT SHORT TERM GOAL #2   Title pt will be able to verbalize / demo proper techniques to reduce R posterior thigh pain via RICE and HEP (01/12/2016)   Time 3   Period Weeks   Status Achieved           PT Long Term Goals - 05/15/16 1610      PT LONG TERM GOAL #1   Title pt will be I with all HEP given as of last visit (04/24/2016)   Time 6   Period Weeks   Status On-going     PT LONG TERM GOAL #2   Title pt will improve her SLR on the R by >/= -20 degrees with </= 2/10 pain to demonstrate decreased hamstring tighntess (02/02/2016)   Period Weeks   Status Achieved     PT LONG TERM GOAL #3   Title pt  will be able to sit for >/= 45 minutes with </= 2/10 pain to assist with work related requirements (02/02/2016)   Time 6   Period Weeks   Status Partially Met     PT LONG TERM GOAL #4   Title pt will be able  jog for >/= 15 minutes with </= 2/10 pain to assist with personal goal of getting back into running and exercise (02/02/2016)   Time 6   Period Weeks   Status On-going     PT LONG TERM GOAL #5   Title pt will improve her LEFS score to >/= 60 points to demonstrate improvement in function at discharge (02/02/2016)   Baseline current score 54.    Time 6   Period Weeks   Status Partially Met               Plan - 05/15/16 1633    Clinical Impression Statement Lynn Gardner continues to demonstrate progress with PT increase trunk mobility and hamstring flexibility. she reports pain at 2/10  located inthe proximal hamstring that is relieved with nerve glides and flossing as well as stretching. she is progressing toward her LTG's. plan to continue with current POC dropping to 1 x a week for the next 4 weeks to work toward remaining goals and independent exercise.    PT Frequency 1x / week   PT Duration 4 weeks   PT Treatment/Interventions ADLs/Self Care Home Management;Cryotherapy;Electrical Stimulation;Iontophoresis 66m/ml Dexamethasone;Moist Heat;Therapeutic exercise;Therapeutic activities;Dry needling;Passive range of motion;Manual techniques;Ultrasound;Taping;Traction   PT Next Visit Plan work toward extension based trunk exercises, lumbar traction PRN, manual/soft tissue work, [place] ionto, tack and stretch for piriformis, KT tape,  extension biased program, DN of the piriformis with E-stim   PT Home Exercise Plan piriformis stretching, prone on elbows and prone press-ups, standing extension, posture education, piriformis stretching, bridges, lateral shift extensions, tennis ball manual trigger point release of piriformis, extension over bolster, nerve glides/ stretching   Consulted  and Agree with Plan of Care Patient      Patient will benefit from skilled therapeutic  intervention in order to improve the following deficits and impairments:  Pain, Improper body mechanics, Postural dysfunction, Decreased endurance, Decreased activity tolerance, Decreased balance, Hypomobility, Increased fascial restricitons  Visit Diagnosis: Pain in right thigh - Plan: PT plan of care cert/re-cert  Other abnormalities of gait and mobility - Plan: PT plan of care cert/re-cert  Other muscle spasm - Plan: PT plan of care cert/re-cert  Chronic right-sided low back pain, with sciatica presence unspecified - Plan: PT plan of care cert/re-cert     Problem List Patient Active Problem List   Diagnosis Date Noted  . Right anterior knee pain 04/28/2015    Starr Lake 05/15/2016, 5:38 PM  [facility] [address] [place], [place], [address] Phone: [phone]   Fax:  [phone]  Name: Lynn Gardner MRN: [id number] Date of Birth: [birth year]

## 2016-05-15 NOTE — Therapy (Signed)
[place], [place], [address] Phone: [phone]   Fax:  [phone]  Physical Therapy Treatment/ Re-certification note  Patient Details  Name: Lynn Gardner MRN: [id number] Date of Birth: 1951-02-08 Referring Provider: Odis Luster  Encounter Date: 05/15/2016      PT End of Session - 05/15/16 1558    Visit Number 22   Number of Visits 28   Date for PT Re-Evaluation 06/19/16   Authorization Type [facility]/ WC    PT Start Time 1550   PT Stop Time 0940   PT Time Calculation (min) 41 min   Activity Tolerance Patient tolerated treatment well   Behavior During Therapy [facility] for tasks assessed/performed      Past Medical History:  Diagnosis Date  . Allergic rhinitis due to pollen   . Allergy   . Essential hypertension, malignant   . Extrinsic asthma, unspecified   . Fibroids   . H/O osteopenia   . Hyperosmolality and/or hypernatremia   . Hypopotassemia   . Palpitations   . Pure hypercholesterolemia   . Vitamin D deficiency     Past Surgical History:  Procedure Laterality Date  . APPENDECTOMY  1972  . TONSILLECTOMY    . WISDOM TOOTH EXTRACTION      There were no vitals filed for this visit.      Subjective Assessment - 05/15/16 1554    Subjective "I was sore over the weekend of Thanksgiving, I am having some spasms still in the low back" pt reports she is getting a shot in her back on the 12th.    Currently in Pain? Yes   Pain Score 2    Pain Location Hip   Pain Orientation Right   Pain Frequency Intermittent   Aggravating Factors  prolonged sitting, driving to work   Pain Score 2   Pain Orientation Right   Pain Frequency Intermittent   Aggravating Factors  prolong sitting/ driving to work   Pain Relieving Factors extension             [facility] PT Assessment - 05/15/16 0001      Observation/Other Assessments   Lower Extremity Functional Scale  54/80     AROM   Lumbar  Flexion 70  small pinch in the glute at end range flexion   Lumbar Extension 22  relief of pain    Lumbar - Right Side Bend 22  pulling in the glute at end range   Lumbar - Left Side Bend 22     Flexibility   Hamstrings R hamstring able to get 65 degrees with knee straight   with no pain                      [facility] Adult PT Treatment/Exercise - 05/15/16 0001      Lumbar Exercises: Stretches   Active Hamstring Stretch 3 reps;30 seconds   Press Ups --  2 sets x 20      Manual Therapy   Manual Therapy Neural Stretch   Manual therapy comments tack and stretch over piriformis 10 x 5 sec hold   Soft tissue mobilization IASTM of R mid hamstring   Myofascial Release fascial stretcing over R hamstring    Neural Stretch sciatic nerve stretching 3 x 20 in supine with, also in sitting 2 x 20  reported some relief of pain and tightness  PT Education - 05/15/16 1632    Education provided Yes   Education Details updated HEP for nueral glides/ flossing   Person(s) Educated Patient   Methods Explanation;Verbal cues;Handout   Comprehension Verbalized understanding;Verbal cues required          PT Short Term Goals - 01/18/16 1735      PT SHORT TERM GOAL #1   Title pt will be I with inital HEP as of last visit (01/12/2016)   Time 3   Period Weeks   Status Achieved     PT SHORT TERM GOAL #2   Title pt will be able to verbalize / demo proper techniques to reduce R posterior thigh pain via RICE and HEP (01/12/2016)   Time 3   Period Weeks   Status Achieved           PT Long Term Goals - 05/15/16 1610      PT LONG TERM GOAL #1   Title pt will be I with all HEP given as of last visit (04/24/2016)   Time 6   Period Weeks   Status On-going     PT LONG TERM GOAL #2   Title pt will improve her SLR on the R by >/= -20 degrees with </= 2/10 pain to demonstrate decreased hamstring tighntess (02/02/2016)   Period Weeks   Status Achieved     PT LONG  TERM GOAL #3   Title pt will be able to sit for >/= 45 minutes with </= 2/10 pain to assist with work related requirements (02/02/2016)   Time 6   Period Weeks   Status Partially Met     PT LONG TERM GOAL #4   Title pt will be able  jog for >/= 15 minutes with </= 2/10 pain to assist with personal goal of getting back into running and exercise (02/02/2016)   Time 6   Period Weeks   Status On-going     PT LONG TERM GOAL #5   Title pt will improve her LEFS score to >/= 60 points to demonstrate improvement in function at discharge (02/02/2016)   Baseline current score 54.    Time 6   Period Weeks   Status Partially Met               Plan - 05/15/16 1633    Clinical Impression Statement Mrs. Doenges continues to demonstrate progress with PT increase trunk mobility and hamstring flexibility. she reports pain at 2/10  located inthe proximal hamstring that is relieved with nerve glides and flossing as well as stretching. she is progressing toward her LTG's. plan to continue with current POC dropping to 1 x a week for the next 4 weeks to work toward remaining goals and independent exercise.    PT Frequency 1x / week   PT Duration 4 weeks   PT Treatment/Interventions ADLs/Self Care Home Management;Cryotherapy;Electrical Stimulation;Iontophoresis 82m/ml Dexamethasone;Moist Heat;Therapeutic exercise;Therapeutic activities;Dry needling;Passive range of motion;Manual techniques;Ultrasound;Taping;Traction   PT Next Visit Plan work toward extension based trunk exercises, lumbar traction PRN, manual/soft tissue work, [place] ionto, tack and stretch for piriformis, KT tape,  extension biased program, DN of the piriformis with E-stim   PT Home Exercise Plan piriformis stretching, prone on elbows and prone press-ups, standing extension, posture education, piriformis stretching, bridges, lateral shift extensions, tennis ball manual trigger point release of piriformis, extension over bolster, nerve glides/  stretching   Consulted and Agree with Plan of Care Patient      Patient will benefit  from skilled therapeutic intervention in order to improve the following deficits and impairments:  Pain, Improper body mechanics, Postural dysfunction, Decreased endurance, Decreased activity tolerance, Decreased balance, Hypomobility, Increased fascial restricitons  Visit Diagnosis: Pain in right thigh - Plan: PT plan of care cert/re-cert  Other abnormalities of gait and mobility - Plan: PT plan of care cert/re-cert  Other muscle spasm - Plan: PT plan of care cert/re-cert  Chronic right-sided low back pain, with sciatica presence unspecified - Plan: PT plan of care cert/re-cert     Problem List Patient Active Problem List   Diagnosis Date Noted  . Right anterior knee pain 04/28/2015   Starr Lake PT, DPT, LAT, ATC  05/15/16  5:39 PM      [facility] [address] [place], [place], [address] Phone: [phone]   Fax:  [phone]  Name: ZENITA KISTER MRN: [id number] Date of Birth: 19-May-1951

## 2016-05-17 ENCOUNTER — Ambulatory Visit: Payer: PRIVATE HEALTH INSURANCE | Admitting: Physical Therapy

## 2016-05-17 DIAGNOSIS — G8929 Other chronic pain: Secondary | ICD-10-CM

## 2016-05-17 DIAGNOSIS — M62838 Other muscle spasm: Secondary | ICD-10-CM

## 2016-05-17 DIAGNOSIS — M79651 Pain in right thigh: Secondary | ICD-10-CM | POA: Diagnosis not present

## 2016-05-17 DIAGNOSIS — R2689 Other abnormalities of gait and mobility: Secondary | ICD-10-CM

## 2016-05-17 DIAGNOSIS — M545 Low back pain: Secondary | ICD-10-CM

## 2016-05-17 NOTE — Therapy (Signed)
[place], [place], [address] Phone: [phone]   Fax:  [phone]  Physical Therapy Treatment  Patient Details  Name: Lynn Gardner MRN: [id number] Date of Birth: 1950/10/11 Referring Provider: Odis Luster  Encounter Date: 05/17/2016      PT End of Session - 05/17/16 1232    Visit Number 23   Number of Visits 28   Date for PT Re-Evaluation 06/19/16   PT Start Time 1100   PT Stop Time 1150   PT Time Calculation (min) 50 min   Activity Tolerance Patient tolerated treatment well   Behavior During Therapy [facility] for tasks assessed/performed      Past Medical History:  Diagnosis Date  . Allergic rhinitis due to pollen   . Allergy   . Essential hypertension, malignant   . Extrinsic asthma, unspecified   . Fibroids   . H/O osteopenia   . Hyperosmolality and/or hypernatremia   . Hypopotassemia   . Palpitations   . Pure hypercholesterolemia   . Vitamin D deficiency     Past Surgical History:  Procedure Laterality Date  . APPENDECTOMY  1972  . TONSILLECTOMY    . WISDOM TOOTH EXTRACTION      There were no vitals filed for this visit.      Subjective Assessment - 05/17/16 1106    Subjective "I am doing well today, it is one of my good days, the nerve glides have really helped"   Currently in Pain? Yes   Pain Score 1    Pain Location Hip   Pain Orientation Right   Pain Score 1   Pain Location Back                         [facility] Adult PT Treatment/Exercise - 05/17/16 1109      Lumbar Exercises: Stretches   Active Hamstring Stretch 3 reps;30 seconds  with strap   Piriformis Stretch 2 reps;30 seconds     Lumbar Exercises: Prone   Opposite Arm/Leg Raise Right arm/Left leg;Left arm/Right leg;10 reps;1 second  on stomach x 2 sets     Knee/Hip Exercises: Aerobic   Nustep L 4 x 6 min  UE/LE     Knee/Hip Exercises: Supine   Bridges Limitations 2 x 10     Manual  Therapy   Manual Therapy Joint mobilization   Manual therapy comments tack and stretch over piriformis 10 x 5 sec hold   Joint Mobilization Grade 3 L1-L5 PA central, and unilateral at L4-L5   Myofascial Release DTM over bil paraspinals and hamstrings   Neural Stretch sciatic nerve stretching 3 x 20 in supine with, also in sitting 2 x 20                  PT Short Term Goals - 01/18/16 1735      PT SHORT TERM GOAL #1   Title pt will be I with inital HEP as of last visit (01/12/2016)   Time 3   Period Weeks   Status Achieved     PT SHORT TERM GOAL #2   Title pt will be able to verbalize / demo proper techniques to reduce R posterior thigh pain via RICE and HEP (01/12/2016)   Time 3   Period Weeks   Status Achieved           PT Long Term Goals - 05/17/16 1237      PT LONG TERM GOAL #  1   Title pt will be I with all HEP given as of last visit (04/24/2016)   Time 6   Period Weeks   Status On-going     PT LONG TERM GOAL #2   Title pt will improve her SLR on the R by >/= -20 degrees with </= 2/10 pain to demonstrate decreased hamstring tighntess (02/02/2016)   Time 6   Period Weeks   Status Achieved     PT LONG TERM GOAL #3   Title pt will be able to sit for >/= 45 minutes with </= 2/10 pain to assist with work related requirements (02/02/2016)   Time 6   Period Weeks   Status Partially Met     PT LONG TERM GOAL #4   Title pt will be able  jog for >/= 15 minutes with </= 2/10 pain to assist with personal goal of getting back into running and exercise (02/02/2016)   Status On-going     PT LONG TERM GOAL #5   Title pt will improve her LEFS score to >/= 60 points to demonstrate improvement in function at discharge (02/02/2016)   Time 6   Period Weeks   Status Partially Met               Plan - 05/17/16 1235    Clinical Impression Statement Mrs. ernster continues to progress reporting pain to 1/10 today. progress to prone lumbar extension with alternating  UE/LE for core strenghtneing and bridges to continue with extenison bias. Following manual and nerve glides she reported no pain inthe back and only 1/10 pain in the hamstring.    PT Next Visit Plan work toward extension based trunk exercises, lumbar traction PRN, manual/soft tissue work, [place] vs ionto,  extension biased program   PT Home Exercise Plan piriformis stretching, prone on elbows and prone press-ups, standing extension, posture education, piriformis stretching, bridges, lateral shift extensions, tennis ball manual trigger point release of piriformis, extension over bolster, nerve glides/ stretching, alternating UE/LE   Consulted and Agree with Plan of Care Patient      Patient will benefit from skilled therapeutic intervention in order to improve the following deficits and impairments:  Pain, Improper body mechanics, Postural dysfunction, Decreased endurance, Decreased activity tolerance, Decreased balance, Hypomobility, Increased fascial restricitons  Visit Diagnosis: Pain in right thigh  Other abnormalities of gait and mobility  Other muscle spasm  Chronic right-sided low back pain, with sciatica presence unspecified     Problem List Patient Active Problem List   Diagnosis Date Noted  . Right anterior knee pain 04/28/2015   Starr Lake PT, DPT, LAT, ATC  05/17/16  12:38 PM      [place] [facility] [address] [place], [place], [address] Phone: [phone]   Fax:  [phone]  Name: Lynn Gardner MRN: [id number] Date of Birth: 01/10/1951

## 2016-05-26 MED FILL — AMLODIPINE BESYLATE 5 MG TA: 5 | 90 days supply | Qty: 135 | Fill #0

## 2016-05-29 ENCOUNTER — Ambulatory Visit: Payer: PRIVATE HEALTH INSURANCE | Admitting: Physical Therapy

## 2016-05-29 DIAGNOSIS — R2689 Other abnormalities of gait and mobility: Secondary | ICD-10-CM

## 2016-05-29 DIAGNOSIS — G8929 Other chronic pain: Secondary | ICD-10-CM

## 2016-05-29 DIAGNOSIS — M62838 Other muscle spasm: Secondary | ICD-10-CM

## 2016-05-29 DIAGNOSIS — M79651 Pain in right thigh: Secondary | ICD-10-CM | POA: Diagnosis not present

## 2016-05-29 DIAGNOSIS — M545 Low back pain: Secondary | ICD-10-CM

## 2016-05-29 NOTE — Therapy (Signed)
[place], [place], [address] Phone: [phone]   Fax:  [phone]  Physical Therapy Treatment  Patient Details  Name: Lynn Gardner MRN: [id number] Date of Birth: April 23, 1951 Referring Provider: Odis Gardner  Encounter Date: 05/29/2016      PT End of Session - 05/29/16 1500    Visit Number 24   Number of Visits 28   Date for PT Re-Evaluation 06/19/16   PT Start Time 1500   PT Stop Time 1324   PT Time Calculation (min) 48 min   Activity Tolerance Patient tolerated treatment well   Behavior During Therapy [facility] for tasks assessed/performed      Past Medical History:  Diagnosis Date  . Allergic rhinitis due to pollen   . Allergy   . Essential hypertension, malignant   . Extrinsic asthma, unspecified   . Fibroids   . H/O osteopenia   . Hyperosmolality and/or hypernatremia   . Hypopotassemia   . Palpitations   . Pure hypercholesterolemia   . Vitamin D deficiency     Past Surgical History:  Procedure Laterality Date  . APPENDECTOMY  1972  . TONSILLECTOMY    . WISDOM TOOTH EXTRACTION      There were no vitals filed for this visit.      Subjective Assessment - 05/29/16 1500    Subjective "I am doing better after the injection on Tuesday Afternoon, but I am feeling alittle achiness today"    Currently in Pain? Yes   Pain Score 1    Pain Location Hip   Pain Orientation Right   Pain Descriptors / Indicators Aching   Pain Type Chronic pain   Pain Onset More than a month ago   Pain Frequency Intermittent   Aggravating Factors  driving, prolong siting, sleeping.    Pain Score 1   Pain Location Back   Pain Orientation Right   Pain Descriptors / Indicators Aching   Pain Type Chronic pain   Pain Onset More than a month ago   Pain Frequency Intermittent   Aggravating Factors  prolonged sitting   Pain Relieving Factors extension, resting                          [facility] Adult PT Treatment/Exercise - 05/29/16 0001      Lumbar Exercises: Stretches   Active Hamstring Stretch 4 reps;30 seconds  with contract/ relax technique with 10 sec hold   Lower Trunk Rotation 3 reps;30 seconds     Lumbar Exercises: Seated   Hip Flexion on Ball Strengthening;Both;20 reps  2 sets keeping core tight and anterior pelvic tilt   Hip Flexion on Ball Limitations 1 set with alternating UE/LE , while sitting on dynadisc keeping good posture perofrming neural glides 2 x 20 reps with ankle pumps     Lumbar Exercises: Supine   Bridge 10 reps;2 seconds  keepin core tight and squeezing ball betweekn the knees     Lumbar Exercises: Prone   Opposite Arm/Leg Raise Right arm/Left leg;Left arm/Right leg;10 reps;1 second     Knee/Hip Exercises: Aerobic   Nustep L 5 x 8 min  LE only     Knee/Hip Exercises: Supine   Other Supine Knee/Hip Exercises dead bug 4 x 15  given as HEP     Manual Therapy   Myofascial Release DTM over proximal  hamstrings   Neural Stretch sciatic nerve stretching 3 x 20 in supine with, also in  sitting 2 x 20                PT Education - 05/29/16 1547    Education provided Yes   Education Details updated HEP for core strenthening with proper form and rationale   Person(s) Educated Patient   Methods Explanation;Verbal cues;Handout   Comprehension Verbalized understanding;Verbal cues required          PT Short Term Goals - 01/18/16 1735      PT SHORT TERM GOAL #1   Title pt will be I with inital HEP as of last visit (01/12/2016)   Time 3   Period Weeks   Status Achieved     PT SHORT TERM GOAL #2   Title pt will be able to verbalize / demo proper techniques to reduce R posterior thigh pain via RICE and HEP (01/12/2016)   Time 3   Period Weeks   Status Achieved           PT Long Term Goals - 05/17/16 1237      PT LONG TERM GOAL #1   Title pt will be I with all HEP given as of last visit (04/24/2016)   Time 6   Period Weeks    Status On-going     PT LONG TERM GOAL #2   Title pt will improve her SLR on the R by >/= -20 degrees with </= 2/10 pain to demonstrate decreased hamstring tighntess (02/02/2016)   Time 6   Period Weeks   Status Achieved     PT LONG TERM GOAL #3   Title pt will be able to sit for >/= 45 minutes with </= 2/10 pain to assist with work related requirements (02/02/2016)   Time 6   Period Weeks   Status Partially Met     PT LONG TERM GOAL #4   Title pt will be able  jog for >/= 15 minutes with </= 2/10 pain to assist with personal goal of getting back into running and exercise (02/02/2016)   Status On-going     PT LONG TERM GOAL #5   Title pt will improve her LEFS score to >/= 60 points to demonstrate improvement in function at discharge (02/02/2016)   Time 6   Period Weeks   Status Partially Met               Plan - 05/29/16 1711    Clinical Impression Statement [name] reports receiving low back injection Last Tuesday which she reported benefit initially with some return of soreness over the weekend. Today she was 1/10, focused today most on exercise promote core strengthening. she was able to complete all exercises given with minimal report of soreness. she declined modalitis post session. she would benefit from conintued PT to promote core strength and functional lifting and carrying biomechanics and work toward discharge.    PT Next Visit Plan core strengthening, functional lifting/ mobility, soft tissue work and modalities PRN,   PT Home Exercise Plan piriformis stretching, prone on elbows and prone press-ups, standing extension, posture education, piriformis stretching, bridges, lateral shift extensions, tennis ball manual trigger point release of piriformis, extension over bolster, nerve glides/ stretching, alternating UE/LE   Consulted and Agree with Plan of Care Patient      Patient will benefit from skilled therapeutic intervention in order to improve the following  deficits and impairments:  Pain, Improper body mechanics, Postural dysfunction, Decreased endurance, Decreased activity tolerance, Decreased balance, Hypomobility, Increased fascial restricitons  Visit Diagnosis:  Pain in right thigh  Other abnormalities of gait and mobility  Other muscle spasm  Chronic right-sided low back pain, with sciatica presence unspecified     Problem List Patient Active Problem List   Diagnosis Date Noted  . Right anterior knee pain 04/28/2015   Starr Lake PT, DPT, LAT, ATC  05/29/16  5:18 PM      [place] [facility] [address] [place], [place], [address] Phone: [phone]   Fax:  [phone]  Name: Lynn Gardner MRN: [id number] Date of Birth: 1951-05-16

## 2016-05-30 DIAGNOSIS — J309 Allergic rhinitis, unspecified: Secondary | ICD-10-CM | POA: Diagnosis not present

## 2016-05-30 DIAGNOSIS — M47816 Spondylosis without myelopathy or radiculopathy, lumbar region: Secondary | ICD-10-CM | POA: Diagnosis not present

## 2016-05-30 DIAGNOSIS — I10 Essential (primary) hypertension: Secondary | ICD-10-CM | POA: Diagnosis not present

## 2016-05-30 DIAGNOSIS — E785 Hyperlipidemia, unspecified: Secondary | ICD-10-CM | POA: Diagnosis not present

## 2016-06-09 ENCOUNTER — Ambulatory Visit: Payer: PRIVATE HEALTH INSURANCE | Admitting: Physical Therapy

## 2016-06-09 DIAGNOSIS — M545 Low back pain: Secondary | ICD-10-CM

## 2016-06-09 DIAGNOSIS — M62838 Other muscle spasm: Secondary | ICD-10-CM

## 2016-06-09 DIAGNOSIS — M79651 Pain in right thigh: Secondary | ICD-10-CM | POA: Diagnosis not present

## 2016-06-09 DIAGNOSIS — G8929 Other chronic pain: Secondary | ICD-10-CM

## 2016-06-09 DIAGNOSIS — R2689 Other abnormalities of gait and mobility: Secondary | ICD-10-CM

## 2016-06-09 NOTE — Therapy (Signed)
[place], [place], [address] Phone: [phone]   Fax:  [phone]  Physical Therapy Treatment  Patient Details  Name: HARMONEY SIENKIEWICZ MRN: [id number] Date of Birth: 12/19/1950 Referring Provider: Odis Luster  Encounter Date: 06/09/2016      PT End of Session - 06/09/16 1144    Visit Number 25   Number of Visits 28   Date for PT Re-Evaluation 06/19/16   Authorization Type [facility] UMR/ WC    PT Start Time 1100   PT Stop Time 1153   PT Time Calculation (min) 53 min   Activity Tolerance Patient tolerated treatment well   Behavior During Therapy [facility] for tasks assessed/performed      Past Medical History:  Diagnosis Date  . Allergic rhinitis due to pollen   . Allergy   . Essential hypertension, malignant   . Extrinsic asthma, unspecified   . Fibroids   . H/O osteopenia   . Hyperosmolality and/or hypernatremia   . Hypopotassemia   . Palpitations   . Pure hypercholesterolemia   . Vitamin D deficiency     Past Surgical History:  Procedure Laterality Date  . APPENDECTOMY  1972  . TONSILLECTOMY    . WISDOM TOOTH EXTRACTION      There were no vitals filed for this visit.      Subjective Assessment - 06/09/16 1104    Subjective "after the last session I was a little sore, but it felt good"    Pain Score 2    Pain Location Back   Pain Orientation Right   Pain Descriptors / Indicators Aching   Pain Type Chronic pain   Pain Onset More than a month ago   Pain Frequency Intermittent   Pain Score 1   Aggravating Factors  prolonged sitting   Pain Relieving Factors extension, resting                          [facility] Adult PT Treatment/Exercise - 06/09/16 1108      Lumbar Exercises: Stretches   Active Hamstring Stretch 2 reps;30 seconds     Lumbar Exercises: Standing   Other Standing Lumbar Exercises lifts/ chops 2 x 10 performed bil   Lifts with red theraband, and chops  with green     Lumbar Exercises: Supine   Other Supine Lumbar Exercises dead bug 2 x 30 sec hold, alternating arm/ LE 2 x 8     Knee/Hip Exercises: Aerobic   Nustep L5 x 5 min, L7 x 5 min  UE/LE     Knee/Hip Exercises: Seated   Hamstring Curl Right;2 sets;10 reps  focusing on eccentrics using red theraband     Moist Heat Therapy   Number Minutes Moist Heat 10 Minutes   Moist Heat Location Lumbar Spine  with pt in prone                  PT Short Term Goals - 01/18/16 1735      PT SHORT TERM GOAL #1   Title pt will be I with inital HEP as of last visit (01/12/2016)   Time 3   Period Weeks   Status Achieved     PT SHORT TERM GOAL #2   Title pt will be able to verbalize / demo proper techniques to reduce R posterior thigh pain via RICE and HEP (01/12/2016)   Time 3   Period Weeks   Status Achieved  PT Long Term Goals - 05/17/16 1237      PT LONG TERM GOAL #1   Title pt will be I with all HEP given as of last visit (04/24/2016)   Time 6   Period Weeks   Status On-going     PT LONG TERM GOAL #2   Title pt will improve her SLR on the R by >/= -20 degrees with </= 2/10 pain to demonstrate decreased hamstring tighntess (02/02/2016)   Time 6   Period Weeks   Status Achieved     PT LONG TERM GOAL #3   Title pt will be able to sit for >/= 45 minutes with </= 2/10 pain to assist with work related requirements (02/02/2016)   Time 6   Period Weeks   Status Partially Met     PT LONG TERM GOAL #4   Title pt will be able  jog for >/= 15 minutes with </= 2/10 pain to assist with personal goal of getting back into running and exercise (02/02/2016)   Status On-going     PT LONG TERM GOAL #5   Title pt will improve her LEFS score to >/= 60 points to demonstrate improvement in function at discharge (02/02/2016)   Time 6   Period Weeks   Status Partially Met               Plan - 06/09/16 1145    Clinical Impression Statement pt reported only 2/10 pain  over the holiday. conintued working on core strengtheing and started hamstring eccentrics which she reported only muscle soreness in the low back. Utilized MHP post session for muscle soreness.    PT Next Visit Plan core strengthening, functional lifting/ mobility, soft tissue work and modalities PRN,   PT Home Exercise Plan piriformis stretching, prone on elbows and prone press-ups, standing extension, posture education, piriformis stretching, bridges, lateral shift extensions, tennis ball manual trigger point release of piriformis, extension over bolster, nerve glides/ stretching, alternating UE/LE, eccentric hamstring curls.   Consulted and Agree with Plan of Care Patient      Patient will benefit from skilled therapeutic intervention in order to improve the following deficits and impairments:  Pain, Improper body mechanics, Postural dysfunction, Decreased endurance, Decreased activity tolerance, Decreased balance, Hypomobility, Increased fascial restricitons  Visit Diagnosis: Pain in right thigh  Other abnormalities of gait and mobility  Other muscle spasm  Chronic right-sided low back pain, with sciatica presence unspecified     Problem List Patient Active Problem List   Diagnosis Date Noted  . Right anterior knee pain 04/28/2015   Starr Lake PT, DPT, LAT, ATC  06/09/16  11:48 AM      [facility] [facility] [address] [place], [place], [address] Phone: [phone]   Fax:  [phone]  Name: CORALEIGH SHEERAN MRN: [id number] Date of Birth: August 30, 1950

## 2016-06-13 ENCOUNTER — Ambulatory Visit: Payer: PRIVATE HEALTH INSURANCE | Attending: Internal Medicine | Admitting: Physical Therapy

## 2016-06-13 DIAGNOSIS — M79651 Pain in right thigh: Secondary | ICD-10-CM | POA: Insufficient documentation

## 2016-06-13 DIAGNOSIS — R2689 Other abnormalities of gait and mobility: Secondary | ICD-10-CM | POA: Insufficient documentation

## 2016-06-13 DIAGNOSIS — G8929 Other chronic pain: Secondary | ICD-10-CM | POA: Insufficient documentation

## 2016-06-13 DIAGNOSIS — M545 Low back pain: Secondary | ICD-10-CM | POA: Diagnosis present

## 2016-06-13 DIAGNOSIS — M62838 Other muscle spasm: Secondary | ICD-10-CM | POA: Diagnosis present

## 2016-06-13 NOTE — Therapy (Signed)
[place], [place], [address] Phone: [phone]   Fax:  [phone]  Physical Therapy Treatment  Patient Details  Name: Lynn Gardner MRN: [id number] Date of Birth: 1950-10-29 Referring Provider: Odis Luster  Encounter Date: 06/13/2016      PT End of Session - 06/13/16 1649    Visit Number 26   Number of Visits 28   Date for PT Re-Evaluation 06/19/16   PT Start Time 9528   PT Stop Time 1628   PT Time Calculation (min) 42 min   Activity Tolerance Patient tolerated treatment well   Behavior During Therapy [facility] for tasks assessed/performed      Past Medical History:  Diagnosis Date  . Allergic rhinitis due to pollen   . Allergy   . Essential hypertension, malignant   . Extrinsic asthma, unspecified   . Fibroids   . H/O osteopenia   . Hyperosmolality and/or hypernatremia   . Hypopotassemia   . Palpitations   . Pure hypercholesterolemia   . Vitamin D deficiency     Past Surgical History:  Procedure Laterality Date  . APPENDECTOMY  1972  . TONSILLECTOMY    . WISDOM TOOTH EXTRACTION      There were no vitals filed for this visit.      Subjective Assessment - 06/13/16 1546    Subjective "I am feeling more sore today, still in the same spot in the hamstring"                         [facility] Adult PT Treatment/Exercise - 06/13/16 0001      Lumbar Exercises: Standing   Other Standing Lumbar Exercises lifting from floor to waist and twisting leading with feet first using box that is 8#      Knee/Hip Exercises: Stretches   Active Hamstring Stretch 3 reps;30 seconds  with contract/ relax with 10 sec hold     Knee/Hip Exercises: Aerobic   Nustep L5 x 5 minLE     Knee/Hip Exercises: Seated   Hamstring Curl Right;2 sets;10 reps  eccentric lowering     Iontophoresis   Type of Iontophoresis Dexamethasone   Location proximal hamstring   Dose 1cc, 4 mg/ml   Time 6  hour patch     Manual Therapy   Myofascial Release DTM over proximal  hamstrings                PT Education - 06/13/16 1649    Education provided Yes   Education Details proper lifting and stepping when twisting to avoid over twisting, staggering her feet during lifting at or above her head.    Person(s) Educated Patient   Methods Explanation;Demonstration;Verbal cues   Comprehension Verbalized understanding;Verbal cues required          PT Short Term Goals - 01/18/16 1735      PT SHORT TERM GOAL #1   Title pt will be I with inital HEP as of last visit (01/12/2016)   Time 3   Period Weeks   Status Achieved     PT SHORT TERM GOAL #2   Title pt will be able to verbalize / demo proper techniques to reduce R posterior thigh pain via RICE and HEP (01/12/2016)   Time 3   Period Weeks   Status Achieved           PT Long Term Goals - 05/17/16 1237      PT LONG TERM  GOAL #1   Title pt will be I with all HEP given as of last visit (04/24/2016)   Time 6   Period Weeks   Status On-going     PT LONG TERM GOAL #2   Title pt will improve her SLR on the R by >/= -20 degrees with </= 2/10 pain to demonstrate decreased hamstring tighntess (02/02/2016)   Time 6   Period Weeks   Status Achieved     PT LONG TERM GOAL #3   Title pt will be able to sit for >/= 45 minutes with </= 2/10 pain to assist with work related requirements (02/02/2016)   Time 6   Period Weeks   Status Partially Met     PT LONG TERM GOAL #4   Title pt will be able  jog for >/= 15 minutes with </= 2/10 pain to assist with personal goal of getting back into running and exercise (02/02/2016)   Status On-going     PT LONG TERM GOAL #5   Title pt will improve her LEFS score to >/= 60 points to demonstrate improvement in function at discharge (02/02/2016)   Time 6   Period Weeks   Status Partially Met               Plan - 06/13/16 1652    Clinical Impression Statement Mrs. Franchino continues to  reported minimal pain at the the proximal hamstring insertion. Focused on manual over the proximal hamstring tendon, and functional lifting which she performed well requiring minimal verbal cues. coninuted Iontophoresis over the proximal hamstring to work on providing relief.    PT Next Visit Plan assess response to iontophoresis. core strengthening, functional lifting/ mobility, soft tissue work and modalities PRN,   PT Home Exercise Plan piriformis stretching, prone on elbows and prone press-ups, standing extension, posture education, piriformis stretching, bridges, lateral shift extensions, tennis ball manual trigger point release of piriformis, extension over bolster, nerve glides/ stretching, alternating UE/LE, eccentric hamstring curls.   Consulted and Agree with Plan of Care Patient      Patient will benefit from skilled therapeutic intervention in order to improve the following deficits and impairments:  Pain, Improper body mechanics, Postural dysfunction, Decreased endurance, Decreased activity tolerance, Decreased balance, Hypomobility, Increased fascial restricitons  Visit Diagnosis: Pain in right thigh  Other abnormalities of gait and mobility  Other muscle spasm  Chronic right-sided low back pain, with sciatica presence unspecified     Problem List Patient Active Problem List   Diagnosis Date Noted  . Right anterior knee pain 04/28/2015   Starr Lake PT, DPT, LAT, ATC  06/13/16  5:30 PM      [place], [place], [address] Phone: [phone]   Fax:  [phone]  Name: Lynn Gardner MRN: [id number] Date of Birth: November 22, 1950

## 2016-06-15 MED FILL — AZITHROMYCIN 250 MG TABLET: 250 | 5 days supply | Qty: 6 | Fill #0

## 2016-06-19 MED FILL — MOMETASONE FUROATE 50 MCG S: 50 | 90 days supply | Qty: 51 | Fill #0

## 2016-06-21 ENCOUNTER — Ambulatory Visit: Payer: PRIVATE HEALTH INSURANCE | Admitting: Physical Therapy

## 2016-06-22 ENCOUNTER — Ambulatory Visit: Payer: PRIVATE HEALTH INSURANCE | Admitting: Physical Therapy

## 2016-06-22 DIAGNOSIS — M79651 Pain in right thigh: Secondary | ICD-10-CM | POA: Diagnosis not present

## 2016-06-22 DIAGNOSIS — M62838 Other muscle spasm: Secondary | ICD-10-CM

## 2016-06-22 DIAGNOSIS — G8929 Other chronic pain: Secondary | ICD-10-CM

## 2016-06-22 DIAGNOSIS — M545 Low back pain: Secondary | ICD-10-CM

## 2016-06-22 DIAGNOSIS — R2689 Other abnormalities of gait and mobility: Secondary | ICD-10-CM

## 2016-06-22 NOTE — Therapy (Addendum)
[place], [place], [address] Phone: [phone]   Fax:  [phone]  Physical Therapy Treatment  Patient Details  Name: Lynn Gardner MRN: [id number] Date of Birth: [birth year] Referring Provider: Odis Luster  Encounter Date: 06/22/2016      PT End of Session - 06/22/16 1135    Visit Number 27   Number of Visits 28   PT Start Time 1017   PT Stop Time 1100   PT Time Calculation (min) 43 min   Activity Tolerance Patient tolerated treatment well   Behavior During Therapy [facility] for tasks assessed/performed      Past Medical History:  Diagnosis Date  . Allergic rhinitis due to pollen   . Allergy   . Essential hypertension, malignant   . Extrinsic asthma, unspecified   . Fibroids   . H/O osteopenia   . Hyperosmolality and/or hypernatremia   . Hypopotassemia   . Palpitations   . Pure hypercholesterolemia   . Vitamin D deficiency     Past Surgical History:  Procedure Laterality Date  . APPENDECTOMY  1972  . TONSILLECTOMY    . WISDOM TOOTH EXTRACTION      There were no vitals filed for this visit.      Subjective Assessment - 06/22/16 1021    Subjective "Same spot aching in the back of the R thigh. The ionto patch did not seem to make a difference"   Limitations Sitting;Lifting;Standing;Walking;House hold activities   Currently in Pain? Yes   Pain Score 0-No pain   Pain Location Back   Pain Orientation Right   Pain Descriptors / Indicators Sore   Pain Score 2   Pain Location Hip   Pain Orientation Right   Pain Descriptors / Indicators Aching   Pain Type Chronic pain   Pain Onset More than a month ago   Pain Frequency Intermittent   Aggravating Factors  prolonged sitting   Pain Relieving Factors PT exercises, stretching                         [facility] Adult PT Treatment/Exercise - 06/22/16 1025      Lumbar Exercises: Standing   Other Standing Lumbar Exercises  lifting from floor using proper techniques and leading with feet instead of twisting; using an 8# box  cueing to bring box closer to the body when lifting     Lumbar Exercises: Supine   Other Supine Lumbar Exercises dead bug 5 x 10 s hold; alternating arm/LE 1 x 10     Lumbar Exercises: Quadruped   Opposite Arm/Leg Raise 10 reps  alternating; cues to keep core tight and back level     Knee/Hip Exercises: Stretches   Active Hamstring Stretch Right;3 reps;30 seconds  10 second hold     Knee/Hip Exercises: Aerobic   Elliptical L2 ramp 10 x 6 minutes     Knee/Hip Exercises: Seated   Hamstring Curl 2 sets;20 reps;Right  20#, eccentric lowering     Manual Therapy   Manual therapy comments manual trigger point compression over R proximal hamstring   Myofascial Release DTM over proximal  hamstrings                PT Education - 06/22/16 1121    Education provided Yes   Education Details Importance of proper lifting and stepping instead of bending and twisting. Bringing the boxes closer to her body when lifting instead of keeping them straight out.  Person(s) Educated Patient   Methods Explanation;Demonstration   Comprehension Verbalized understanding;Verbal cues required          PT Short Term Goals - 01/18/16 1735      PT SHORT TERM GOAL #1   Title pt will be I with inital HEP as of last visit (01/12/2016)   Time 3   Period Weeks   Status Achieved     PT SHORT TERM GOAL #2   Title pt will be able to verbalize / demo proper techniques to reduce R posterior thigh pain via RICE and HEP (01/12/2016)   Time 3   Period Weeks   Status Achieved           PT Long Term Goals - 05/17/16 1237      PT LONG TERM GOAL #1   Title pt will be I with all HEP given as of last visit (04/24/2016)   Time 6   Period Weeks   Status On-going     PT LONG TERM GOAL #2   Title pt will improve her SLR on the R by >/= -20 degrees with </= 2/10 pain to demonstrate decreased hamstring  tighntess (02/02/2016)   Time 6   Period Weeks   Status Achieved     PT LONG TERM GOAL #3   Title pt will be able to sit for >/= 45 minutes with </= 2/10 pain to assist with work related requirements (02/02/2016)   Time 6   Period Weeks   Status Partially Met     PT LONG TERM GOAL #4   Title pt will be able  jog for >/= 15 minutes with </= 2/10 pain to assist with personal goal of getting back into running and exercise (02/02/2016)   Status On-going     PT LONG TERM GOAL #5   Title pt will improve her LEFS score to >/= 60 points to demonstrate improvement in function at discharge (02/02/2016)   Time 6   Period Weeks   Status Partially Met               Plan - 06/22/16 1123    Clinical Impression Statement Mrs. Pollok reported continued pain at a 2/10 at the R proximal hamstring. Focused on core and proper lifting techniques. Required some cueing to bring the box closer when lifting to avoid excess strain. At end of session reported hamstring pain was still at a 2/10.    PT Next Visit Plan core strengthening, functional lifting/ mobility, soft tissue work and modalities PRN, hamstring strengthening   PT Home Exercise Plan piriformis stretching, prone on elbows and prone press-ups, standing extension, posture education, piriformis stretching, bridges, lateral shift extensions, tennis ball manual trigger point release of piriformis, extension over bolster, nerve glides/ stretching, alternating UE/LE, eccentric hamstring curls.   Consulted and Agree with Plan of Care Patient      Patient will benefit from skilled therapeutic intervention in order to improve the following deficits and impairments:  Pain, Improper body mechanics, Postural dysfunction, Decreased endurance, Decreased activity tolerance, Decreased balance, Hypomobility, Increased fascial restricitons  Visit Diagnosis: Pain in right thigh  Other abnormalities of gait and mobility  Other muscle spasm  Chronic  right-sided low back pain, with sciatica presence unspecified     Problem List Patient Active Problem List   Diagnosis Date Noted  . Right anterior knee pain 04/28/2015    Lynn Gardner, SPT 06/22/2016, 12:08 PM  [place],  [place], [address] Phone: [phone]   Fax:  [phone]  Name: Lynn Gardner MRN: [id number] Date of Birth: 1950-12-22   Starr Lake PT, DPT, LAT, ATC  06/26/16  1:09 PM

## 2016-06-26 ENCOUNTER — Ambulatory Visit: Payer: PRIVATE HEALTH INSURANCE | Attending: Internal Medicine | Admitting: Physical Therapy

## 2016-06-26 DIAGNOSIS — G8929 Other chronic pain: Secondary | ICD-10-CM | POA: Diagnosis present

## 2016-06-26 DIAGNOSIS — R2689 Other abnormalities of gait and mobility: Secondary | ICD-10-CM | POA: Diagnosis present

## 2016-06-26 DIAGNOSIS — M545 Low back pain: Secondary | ICD-10-CM | POA: Diagnosis present

## 2016-06-26 DIAGNOSIS — M62838 Other muscle spasm: Secondary | ICD-10-CM | POA: Insufficient documentation

## 2016-06-26 DIAGNOSIS — M79651 Pain in right thigh: Secondary | ICD-10-CM | POA: Insufficient documentation

## 2016-06-26 NOTE — Therapy (Signed)
[place], [place], [address] Phone: [phone]   Fax:  [phone]  Physical Therapy Treatment / Discharge Note  Patient Details  Name: Lynn Gardner MRN: [id number] Date of Birth: 05/03/1951 Referring Provider: Odis Luster  Encounter Date: 06/26/2016      PT End of Session - 06/26/16 1629    Visit Number 28   Number of Visits 28   Date for PT Re-Evaluation 06/19/16   Authorization Type [facility] UMR/ WC    PT Start Time 1545   PT Stop Time 1628   PT Time Calculation (min) 43 min   Activity Tolerance Patient tolerated treatment well   Behavior During Therapy [facility] for tasks assessed/performed      Past Medical History:  Diagnosis Date  . Allergic rhinitis due to pollen   . Allergy   . Essential hypertension, malignant   . Extrinsic asthma, unspecified   . Fibroids   . H/O osteopenia   . Hyperosmolality and/or hypernatremia   . Hypopotassemia   . Palpitations   . Pure hypercholesterolemia   . Vitamin D deficiency     Past Surgical History:  Procedure Laterality Date  . APPENDECTOMY  1972  . TONSILLECTOMY    . WISDOM TOOTH EXTRACTION      There were no vitals filed for this visit.      Subjective Assessment - 06/26/16 1552    Subjective "I am feeling alittle more achey today in that one spot, and was pretty sore after the last session"   Currently in Pain? Yes   Pain Score 0-No pain   Pain Location Back   Pain Orientation Right   Pain Frequency Intermittent   Aggravating Factors  stretching   Pain Relieving Factors leaning back,    Pain Score 2   Pain Location Hip   Pain Orientation Right   Pain Descriptors / Indicators Aching   Pain Type Chronic pain   Pain Frequency Intermittent   Aggravating Factors  driving, prolong sitting   Pain Relieving Factors stretching,             [facility] PT Assessment - 06/26/16 0001      AROM   Lumbar Flexion 71   Lumbar Extension 38    Lumbar - Right Side Bend 30   Lumbar - Left Side Bend 30                     [facility] Adult PT Treatment/Exercise - 06/26/16 0001      Lumbar Exercises: Stretches   Active Hamstring Stretch 3 reps;30 seconds     Knee/Hip Exercises: Stretches   Active Hamstring Stretch 30 seconds;3 reps  contract / relax with 10 sec hold     Manual Therapy   Manual therapy comments manual trigger point compression over R proximal hamstring   Soft tissue mobilization IASTM of R mid/proximal hamstring   Myofascial Release DTM over proximal  hamstrings                PT Education - 06/26/16 1627    Education provided Yes   Education Details reviewed previous HEP, how to progress in order to maintainand promote function with increased reps/ sets or increasing resistance. when returning to the gym and gradually increase to avoid reinury   Person(s) Educated Patient   Methods Explanation;Verbal cues   Comprehension Verbalized understanding;Verbal cues required          PT Short Term Goals - 01/18/16  [place] #1   Title pt will be I with inital HEP as of last visit (01/12/2016)   Time 3   Period Weeks   Status Achieved     PT SHORT TERM GOAL #2   Title pt will be able to verbalize / demo proper techniques to reduce R posterior thigh pain via RICE and HEP (01/12/2016)   Time 3   Period Weeks   Status Achieved           PT Long Term Goals - 06/26/16 1613      PT LONG TERM GOAL #1   Title pt will be I with all HEP given as of last visit (04/24/2016)   Time 6   Period Weeks   Status Achieved     PT LONG TERM GOAL #2   Title pt will improve her SLR on the R by >/= -20 degrees with </= 2/10 pain to demonstrate decreased hamstring tighntess (02/02/2016)   Time 6   Period Weeks   Status Achieved     PT LONG TERM GOAL #3   Title pt will be able to sit for >/= 45 minutes with </= 2/10 pain to assist with work related requirements (02/02/2016)   Time 6    Period Weeks   Status Achieved     PT LONG TERM GOAL #4   Title pt will be able  jog for >/= 15 minutes with </= 2/10 pain to assist with personal goal of getting back into running and exercise (02/02/2016)   Period Weeks   Status Not Met     PT LONG TERM GOAL #5   Title pt will improve her LEFS score to >/= 60 points to demonstrate improvement in function at discharge (02/02/2016)   Time 6   Period Weeks   Status Achieved               Plan - 06/26/16 1629    Clinical Impression Statement Mrs. Biscardi continues to report pain in the Proximal R hamstring at a 2/10. continued soft tissue work and stretching which she reported .5/10 of pain. she met all goals except for LTG #4. She is able to maintain and progress her current level of function independenlty and will be discharged from PT today.    PT Treatment/Interventions ADLs/Self Care Home Management;Cryotherapy;Electrical Stimulation;Iontophoresis 10m/ml Dexamethasone;Moist Heat;Therapeutic exercise;Therapeutic activities;Dry needling;Passive range of motion;Manual techniques;Ultrasound;Taping;Traction   PT Next Visit Plan d/C    Consulted and Agree with Plan of Care Patient      Patient will benefit from skilled therapeutic intervention in order to improve the following deficits and impairments:  Pain, Improper body mechanics, Postural dysfunction, Decreased endurance, Decreased activity tolerance, Decreased balance, Hypomobility, Increased fascial restricitons  Visit Diagnosis: Pain in right thigh - Plan: PT plan of care cert/re-cert  Other abnormalities of gait and mobility - Plan: PT plan of care cert/re-cert  Other muscle spasm - Plan: PT plan of care cert/re-cert  Chronic right-sided low back pain, with sciatica presence unspecified - Plan: PT plan of care cert/re-cert     Problem List Patient Active Problem List   Diagnosis Date Noted  . Right anterior knee pain 04/28/2015   KStarr LakePT, DPT,  LAT, ATC  06/26/16  4:33 PM      [facility] [address]Phone: [phone]  Fax:  [phone] Name: Lynn RAGINMRN: [id number]  of Birth: 16-Aug-1950    PHYSICAL THERAPY DISCHARGE SUMMARY  Visits from Start of Care: 28  Current functional level related to goals / functional outcomes: See goals   Remaining deficits: Intermittent soreness in the right proximal hamstring relieved with stretching and exericse   Education / Equipment: HEP, posture / lifting and carrying, theraband, anatomy of condition  Plan: Patient agrees to discharge.  Patient goals were met. Patient is being discharged due to being pleased with the current functional level.  ?????

## 2016-07-26 MED FILL — GABAPENTIN 300 MG CAPSULE: 300 | 30 days supply | Qty: 30 | Fill #0

## 2016-08-22 MED FILL — AMLODIPINE BESYLATE 5 MG TA: 5 | 90 days supply | Qty: 135 | Fill #1

## 2016-08-29 DIAGNOSIS — J309 Allergic rhinitis, unspecified: Secondary | ICD-10-CM | POA: Diagnosis not present

## 2016-08-29 DIAGNOSIS — I10 Essential (primary) hypertension: Secondary | ICD-10-CM | POA: Diagnosis not present

## 2016-08-29 DIAGNOSIS — E785 Hyperlipidemia, unspecified: Secondary | ICD-10-CM | POA: Diagnosis not present

## 2016-08-29 DIAGNOSIS — K5900 Constipation, unspecified: Secondary | ICD-10-CM | POA: Diagnosis not present

## 2016-10-25 DIAGNOSIS — H2513 Age-related nuclear cataract, bilateral: Secondary | ICD-10-CM | POA: Diagnosis not present

## 2016-10-25 DIAGNOSIS — H10413 Chronic giant papillary conjunctivitis, bilateral: Secondary | ICD-10-CM | POA: Diagnosis not present

## 2016-10-25 DIAGNOSIS — H04123 Dry eye syndrome of bilateral lacrimal glands: Secondary | ICD-10-CM | POA: Diagnosis not present

## 2016-10-25 MED FILL — CROMOLYN 4% EYE DROPS: 4 | 25 days supply | Qty: 10 | Fill #0

## 2016-10-30 MED FILL — POTASSIUM CL ER 20 MEQ TABL: 20 | 90 days supply | Qty: 180 | Fill #0

## 2016-11-10 DIAGNOSIS — R7303 Prediabetes: Secondary | ICD-10-CM | POA: Diagnosis not present

## 2016-11-10 DIAGNOSIS — I10 Essential (primary) hypertension: Secondary | ICD-10-CM | POA: Diagnosis not present

## 2016-11-14 MED FILL — AMLODIPINE BESYLATE 5 MG TA: 5 | 90 days supply | Qty: 135 | Fill #0

## 2016-12-04 DIAGNOSIS — I10 Essential (primary) hypertension: Secondary | ICD-10-CM | POA: Diagnosis not present

## 2016-12-04 DIAGNOSIS — E785 Hyperlipidemia, unspecified: Secondary | ICD-10-CM | POA: Diagnosis not present

## 2016-12-04 DIAGNOSIS — E559 Vitamin D deficiency, unspecified: Secondary | ICD-10-CM | POA: Diagnosis not present

## 2016-12-04 DIAGNOSIS — G5701 Lesion of sciatic nerve, right lower limb: Secondary | ICD-10-CM | POA: Diagnosis not present

## 2017-02-08 DIAGNOSIS — I10 Essential (primary) hypertension: Secondary | ICD-10-CM | POA: Diagnosis not present

## 2017-02-08 DIAGNOSIS — G5701 Lesion of sciatic nerve, right lower limb: Secondary | ICD-10-CM | POA: Diagnosis not present

## 2017-02-08 DIAGNOSIS — M47816 Spondylosis without myelopathy or radiculopathy, lumbar region: Secondary | ICD-10-CM | POA: Diagnosis not present

## 2017-02-08 DIAGNOSIS — J309 Allergic rhinitis, unspecified: Secondary | ICD-10-CM | POA: Diagnosis not present

## 2017-02-08 MED FILL — CLOBETASOL 0.05% CREAM: 0.05 | 30 days supply | Qty: 90 | Fill #0

## 2017-02-09 MED FILL — AMLODIPINE BESYLATE 5 MG TA: 5 | 90 days supply | Qty: 135 | Fill #1

## 2017-02-09 MED FILL — MOMETASONE FUROATE 50 MCG S: 50 | 90 days supply | Qty: 51 | Fill #1

## 2017-02-19 MED FILL — POTASSIUM CL ER 20 MEQ TABL: 20 | 90 days supply | Qty: 180 | Fill #1

## 2017-03-16 DIAGNOSIS — D259 Leiomyoma of uterus, unspecified: Secondary | ICD-10-CM | POA: Diagnosis not present

## 2017-03-16 DIAGNOSIS — Z1231 Encounter for screening mammogram for malignant neoplasm of breast: Secondary | ICD-10-CM | POA: Diagnosis not present

## 2017-03-16 DIAGNOSIS — Z124 Encounter for screening for malignant neoplasm of cervix: Secondary | ICD-10-CM | POA: Diagnosis not present

## 2017-03-16 DIAGNOSIS — Z6826 Body mass index (BMI) 26.0-26.9, adult: Secondary | ICD-10-CM | POA: Diagnosis not present

## 2017-03-16 DIAGNOSIS — N952 Postmenopausal atrophic vaginitis: Secondary | ICD-10-CM | POA: Diagnosis not present

## 2017-03-16 DIAGNOSIS — Z01419 Encounter for gynecological examination (general) (routine) without abnormal findings: Secondary | ICD-10-CM | POA: Diagnosis not present

## 2017-04-05 ENCOUNTER — Other Ambulatory Visit: Payer: Self-pay | Admitting: *Deleted

## 2017-04-05 ENCOUNTER — Other Ambulatory Visit: Payer: Self-pay | Admitting: Obstetrics and Gynecology

## 2017-04-05 DIAGNOSIS — R921 Mammographic calcification found on diagnostic imaging of breast: Secondary | ICD-10-CM

## 2017-04-10 ENCOUNTER — Other Ambulatory Visit: Payer: Self-pay | Admitting: Internal Medicine

## 2017-04-10 ENCOUNTER — Ambulatory Visit
Admission: RE | Admit: 2017-04-10 | Discharge: 2017-04-10 | Disposition: A | Discharge status: Home / Self Care | Payer: 59 | Source: Ambulatory Visit | Attending: Internal Medicine | Admitting: Internal Medicine

## 2017-04-10 DIAGNOSIS — L659 Nonscarring hair loss, unspecified: Secondary | ICD-10-CM | POA: Diagnosis not present

## 2017-04-10 DIAGNOSIS — M79671 Pain in right foot: Secondary | ICD-10-CM

## 2017-04-10 DIAGNOSIS — I10 Essential (primary) hypertension: Secondary | ICD-10-CM | POA: Diagnosis not present

## 2017-04-12 ENCOUNTER — Ambulatory Visit
Admission: RE | Admit: 2017-04-12 | Discharge: 2017-04-12 | Disposition: A | Discharge status: Home / Self Care | Payer: 59 | Source: Ambulatory Visit | Attending: Obstetrics and Gynecology | Admitting: Obstetrics and Gynecology

## 2017-04-12 DIAGNOSIS — N6012 Diffuse cystic mastopathy of left breast: Secondary | ICD-10-CM | POA: Diagnosis not present

## 2017-04-12 DIAGNOSIS — R920 Mammographic microcalcification found on diagnostic imaging of breast: Secondary | ICD-10-CM | POA: Diagnosis not present

## 2017-04-12 DIAGNOSIS — R921 Mammographic calcification found on diagnostic imaging of breast: Secondary | ICD-10-CM

## 2017-04-12 HISTORY — PX: BREAST BIOPSY: SHX20

## 2017-04-27 MED FILL — PEG-3350 SOLUTION: 420 | 1 days supply | Qty: 4000 | Fill #0

## 2017-04-30 MED FILL — AMLODIPINE BESYLATE 5 MG TA: 5 | 90 days supply | Qty: 135 | Fill #2

## 2017-05-11 DIAGNOSIS — Z86010 Personal history of colonic polyps: Secondary | ICD-10-CM | POA: Diagnosis not present

## 2017-05-13 ENCOUNTER — Encounter (INDEPENDENT_AMBULATORY_CARE_PROVIDER_SITE_OTHER): Payer: Self-pay | Admitting: Internal Medicine

## 2017-05-14 MED FILL — AZITHROMYCIN 250 MG TABLET: 250 | 5 days supply | Qty: 6 | Fill #0

## 2017-05-22 MED FILL — POTASSIUM CL ER 20 MEQ TABL: 20 | 90 days supply | Qty: 180 | Fill #2

## 2017-05-29 DIAGNOSIS — I10 Essential (primary) hypertension: Secondary | ICD-10-CM | POA: Diagnosis not present

## 2017-05-29 DIAGNOSIS — L639 Alopecia areata, unspecified: Secondary | ICD-10-CM | POA: Diagnosis not present

## 2017-05-29 DIAGNOSIS — J309 Allergic rhinitis, unspecified: Secondary | ICD-10-CM | POA: Diagnosis not present

## 2017-05-29 DIAGNOSIS — J329 Chronic sinusitis, unspecified: Secondary | ICD-10-CM | POA: Diagnosis not present

## 2017-05-29 MED FILL — MONTELUKAST SOD 10 MG TAB: 10 | 30 days supply | Qty: 30 | Fill #0

## 2017-05-29 MED FILL — AZITHROMYCIN 250 MG TABLET: 250 | 5 days supply | Qty: 6 | Fill #0

## 2017-06-08 MED FILL — DYMISTA NASAL SPRAY: 137-50 | 60 days supply | Qty: 23 | Fill #0

## 2017-08-07 MED FILL — AMLODIPINE BESYLATE 5 MG TA: 5 | 90 days supply | Qty: 135 | Fill #3

## 2017-08-08 MED FILL — POTASSIUM CL ER 20 MEQ TABL: 20 | 90 days supply | Qty: 180 | Fill #0

## 2017-08-28 DIAGNOSIS — J029 Acute pharyngitis, unspecified: Secondary | ICD-10-CM | POA: Diagnosis not present

## 2017-08-28 DIAGNOSIS — J309 Allergic rhinitis, unspecified: Secondary | ICD-10-CM | POA: Diagnosis not present

## 2017-08-28 DIAGNOSIS — I10 Essential (primary) hypertension: Secondary | ICD-10-CM | POA: Diagnosis not present

## 2017-08-28 DIAGNOSIS — E559 Vitamin D deficiency, unspecified: Secondary | ICD-10-CM | POA: Diagnosis not present

## 2017-08-28 MED FILL — AZITHROMYCIN 250 MG TABLET: 250 | 5 days supply | Qty: 6 | Fill #0

## 2017-08-28 MED FILL — MONTELUKAST SOD 10 MG TAB: 10 | 30 days supply | Qty: 30 | Fill #0

## 2017-09-14 DIAGNOSIS — R7303 Prediabetes: Secondary | ICD-10-CM | POA: Diagnosis not present

## 2017-09-14 DIAGNOSIS — I10 Essential (primary) hypertension: Secondary | ICD-10-CM | POA: Diagnosis not present

## 2017-09-14 DIAGNOSIS — E785 Hyperlipidemia, unspecified: Secondary | ICD-10-CM | POA: Diagnosis not present

## 2017-09-14 DIAGNOSIS — E559 Vitamin D deficiency, unspecified: Secondary | ICD-10-CM | POA: Diagnosis not present

## 2017-09-19 MED FILL — RECTASMOOTHE 5% CREAM: 5 | 20 days supply | Qty: 30 | Fill #0

## 2017-10-09 MED FILL — TRIAMTERENE-HCTZ 37.5-25 MG: 37.5-25 | 90 days supply | Qty: 90 | Fill #0

## 2017-10-25 MED FILL — AZITHROMYCIN 250 MG TABLET: 250 | 5 days supply | Qty: 6 | Fill #0

## 2017-11-06 IMAGING — MR MR LUMBAR SPINE W/O CM
4 of 5 series · 19 of 48 positions shown · non-contrast
Comparison: None.

CLINICAL DATA: Low back pain

EXAM:
MRI LUMBAR SPINE WITHOUT CONTRAST
TECHNIQUE: Multiplanar, multisequence MR imaging of the lumbar spine was
performed. No intravenous contrast was administered.

[Series 3: T1 · sagittal · 4.0mm · 0.51mm/px · 3 of 15 slices shown (1 of 2)]
[im 3/15]
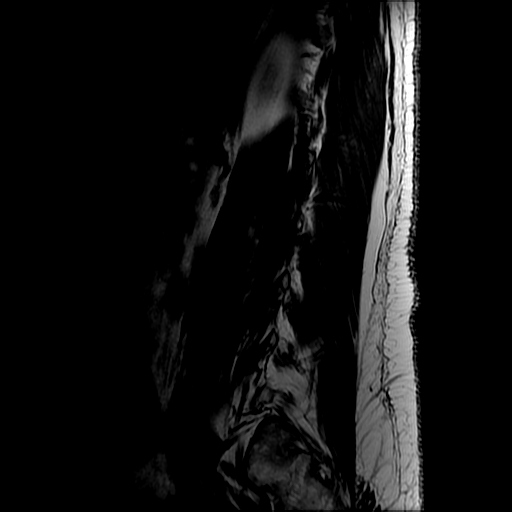
[im 8/15]
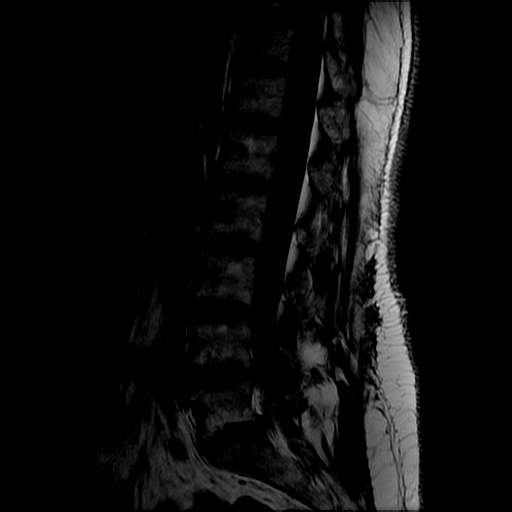
[im 12/15]
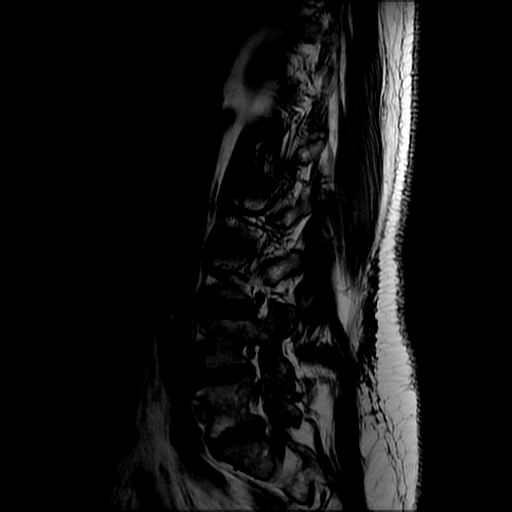

[Series 4: T2 post-contrast · sagittal · 4.0mm · 0.51mm/px · 7 of 15 slices shown]
[im 1/15]
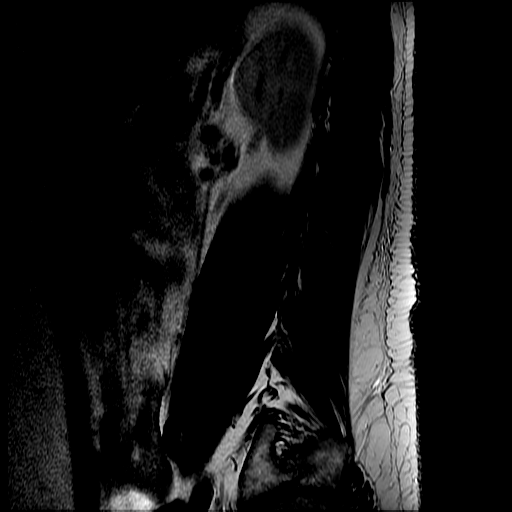
[im 3/15]
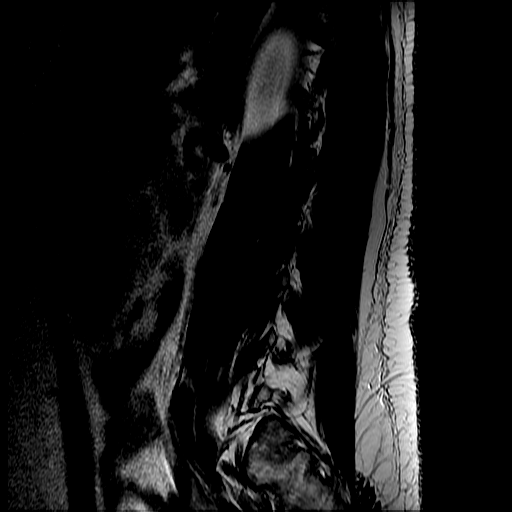
[im 5/15]
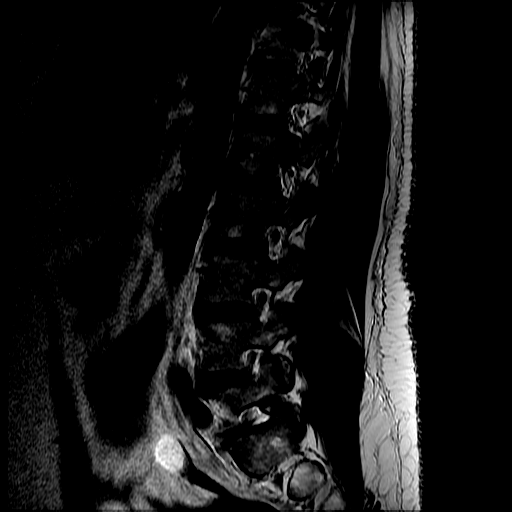
[im 8/15]
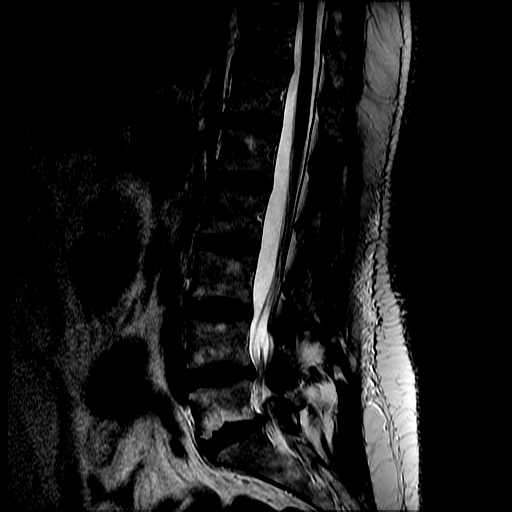
[im 10/15]
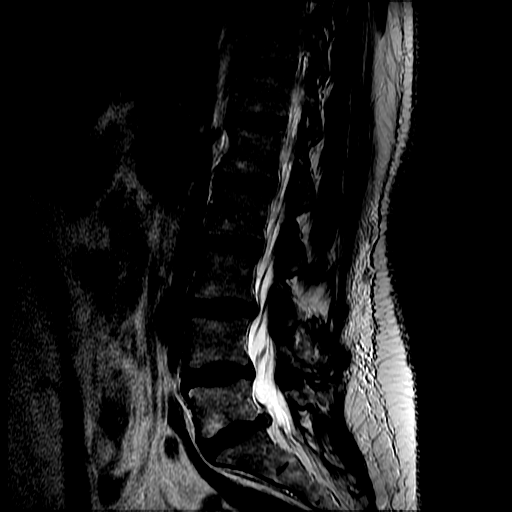
[im 12/15]
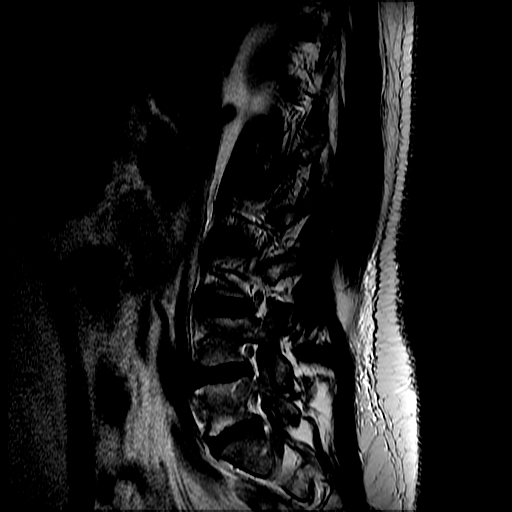
[im 15/15]
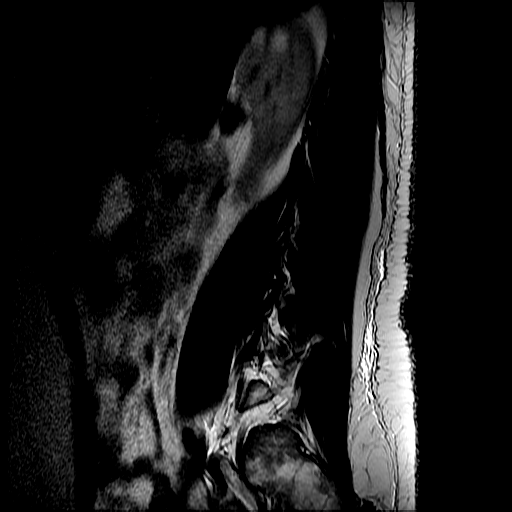

[Series 6: T2 · axial · 4.0mm · 0.39mm/px · z∈[-103,+41]mm · 6 of 34 slices shown]
[im 1/34]
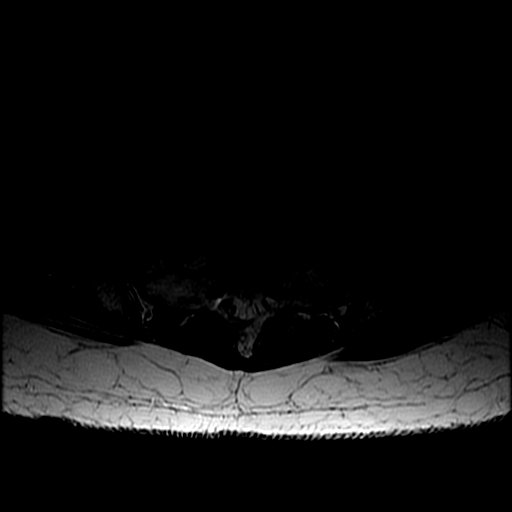
[im 6/34]
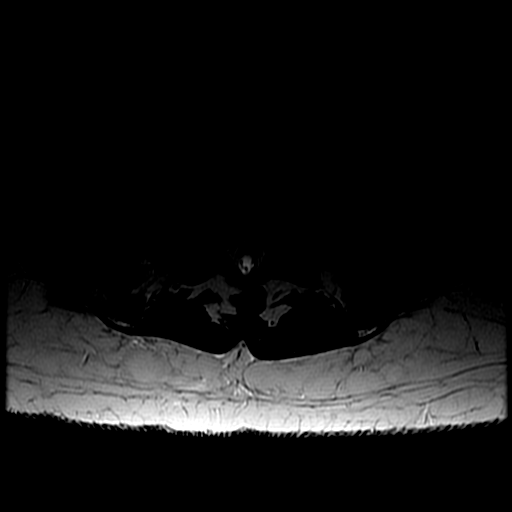
[im 11/34]
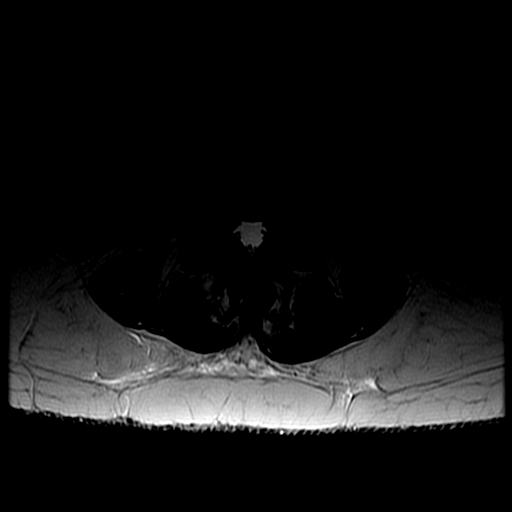
[im 16/34]
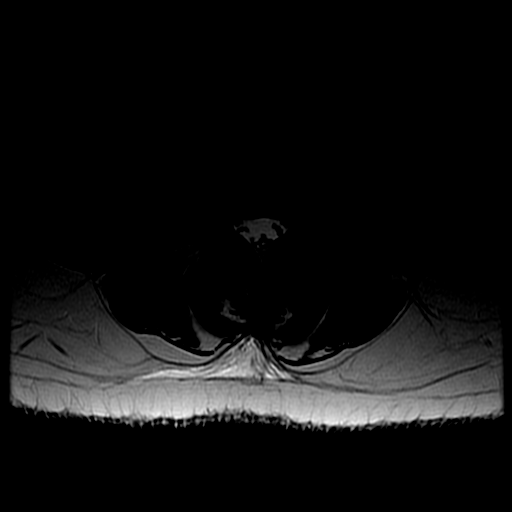
[im 18/34]
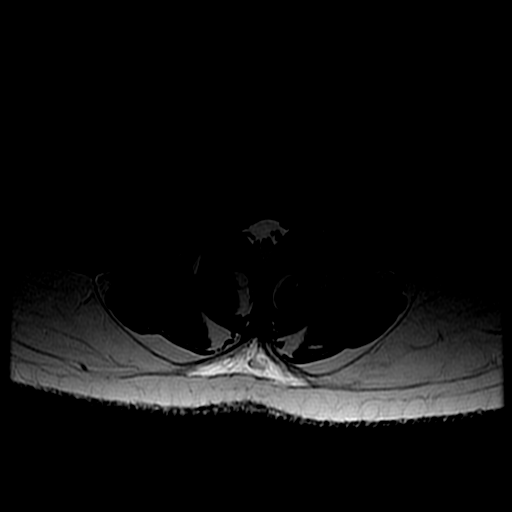
[im 28/34]
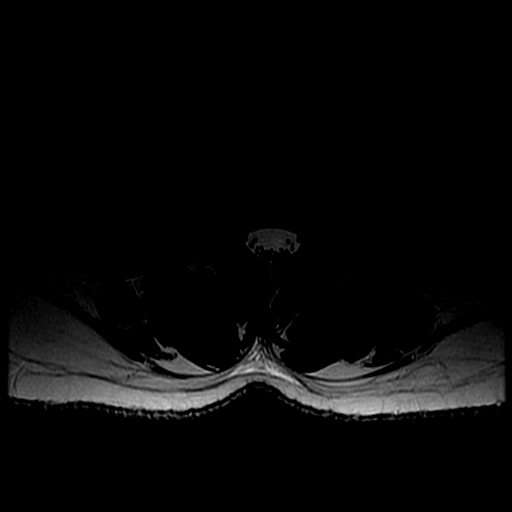

[Series 7: T1 · axial · 4.0mm · 0.39mm/px · z∈[-78,+41]mm · 3 of 34 slices shown (2 of 2)]
[im 6/34]
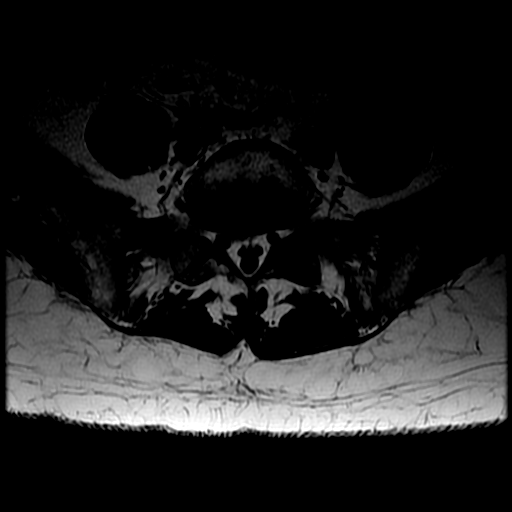
[im 18/34]
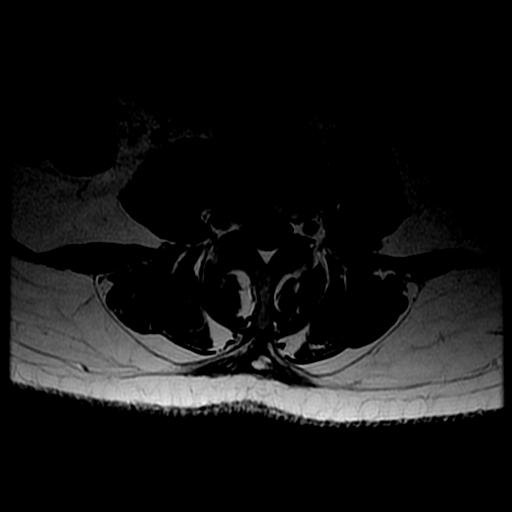
[im 28/34]
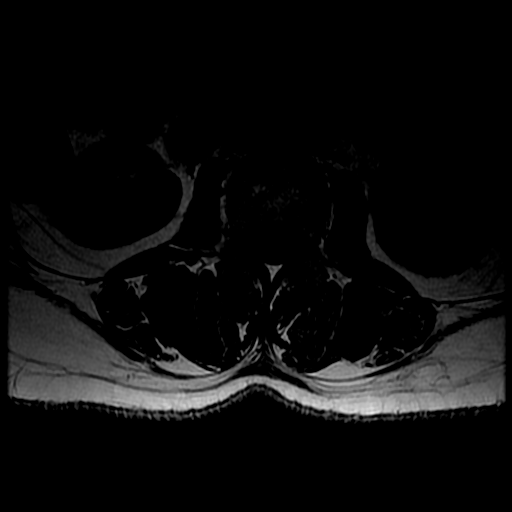

[19 of 48 positions shown; findings below may reference images not displayed]

FINDINGS: Segmentation:  Normal

Alignment:  Normal

Vertebrae: Negative for fracture or mass. Multiple hemangiomata are
present in the lumbar spine.

Conus medullaris: Extends to the L1-2 level and appears normal.

Paraspinal and other soft tissues: 10 mm cyst medial to the left
iliac vessels appears benign. No retroperitoneal adenopathy. 3 cm
right renal cyst. 1 cm right lower pole renal cyst. Negative for
aortic aneurysm.

Disc levels:

L1-2:  Negative

L2-3:  Negative

L3-4: Mild disc degeneration and disc bulging. Small right
paracentral disc protrusion. Possible impingement of the right L4
nerve root in the subarticular zone due to disc protrusion. No
significant spinal stenosis.

L4-5:  Mild disc bulging without significant spinal stenosis.

L5-S1: Small right-sided disc protrusion. Mild posterior
displacement and flattening of the right S1 nerve root due to
mass-effect from disc protrusion. Mild left foraminal encroachment.
IMPRESSION: Small right paracentral disc protrusion L3-4 with possible
impingement of the right L4 nerve root in the subarticular zone

Small right-sided disc protrusion with displacement flattening of
the right S1 nerve root.

## 2017-11-09 MED FILL — POTASSIUM CL ER 20 MEQ TABL: 20 | 90 days supply | Qty: 180 | Fill #1

## 2017-11-09 MED FILL — AMLODIPINE BESYLATE 5 MG TA: 5 | 90 days supply | Qty: 135 | Fill #0

## 2017-11-09 MED FILL — CLOBETASOL PROPIONATE 0.05: 0.05 | 30 days supply | Qty: 90 | Fill #1

## 2017-11-13 DIAGNOSIS — I10 Essential (primary) hypertension: Secondary | ICD-10-CM | POA: Diagnosis not present

## 2017-11-13 DIAGNOSIS — J309 Allergic rhinitis, unspecified: Secondary | ICD-10-CM | POA: Diagnosis not present

## 2017-11-13 DIAGNOSIS — E785 Hyperlipidemia, unspecified: Secondary | ICD-10-CM | POA: Diagnosis not present

## 2017-11-13 DIAGNOSIS — E559 Vitamin D deficiency, unspecified: Secondary | ICD-10-CM | POA: Diagnosis not present

## 2017-12-14 MED FILL — MONTELUKAST SOD 10 MG TAB: 10 | 30 days supply | Qty: 30 | Fill #1

## 2017-12-19 MED FILL — ESTRADIOL 0.1 MG/GM CREA: 0.1 | 90 days supply | Qty: 43 | Fill #0

## 2018-01-18 DIAGNOSIS — R0989 Other specified symptoms and signs involving the circulatory and respiratory systems: Secondary | ICD-10-CM | POA: Diagnosis not present

## 2018-01-18 DIAGNOSIS — I10 Essential (primary) hypertension: Secondary | ICD-10-CM | POA: Diagnosis not present

## 2018-01-18 DIAGNOSIS — J309 Allergic rhinitis, unspecified: Secondary | ICD-10-CM | POA: Diagnosis not present

## 2018-01-18 DIAGNOSIS — R7303 Prediabetes: Secondary | ICD-10-CM | POA: Diagnosis not present

## 2018-01-18 DIAGNOSIS — E559 Vitamin D deficiency, unspecified: Secondary | ICD-10-CM | POA: Diagnosis not present

## 2018-01-18 DIAGNOSIS — E785 Hyperlipidemia, unspecified: Secondary | ICD-10-CM | POA: Diagnosis not present

## 2018-01-22 ENCOUNTER — Encounter (HOSPITAL_BASED_OUTPATIENT_CLINIC_OR_DEPARTMENT_OTHER): Payer: Self-pay

## 2018-01-22 DIAGNOSIS — R0683 Snoring: Secondary | ICD-10-CM

## 2018-01-22 MED FILL — MONTELUKAST SOD 10 MG TAB: 10 | 30 days supply | Qty: 30 | Fill #2

## 2018-01-22 MED FILL — BLOOD PRESSURE MONITOR: 30 days supply | Qty: 1 | Fill #0

## 2018-01-31 MED FILL — AMLODIPINE BESYLATE 5 MG TA: 5 | 90 days supply | Qty: 135 | Fill #1

## 2018-02-22 ENCOUNTER — Ambulatory Visit (HOSPITAL_BASED_OUTPATIENT_CLINIC_OR_DEPARTMENT_OTHER): Payer: 59 | Attending: Internal Medicine | Admitting: Internal Medicine

## 2018-02-22 VITALS — Ht 63.0 in | Wt 148.0 lb

## 2018-02-22 DIAGNOSIS — R0683 Snoring: Secondary | ICD-10-CM | POA: Insufficient documentation

## 2018-02-22 DIAGNOSIS — G4733 Obstructive sleep apnea (adult) (pediatric): Secondary | ICD-10-CM | POA: Insufficient documentation

## 2018-02-28 MED FILL — MONTELUKAST SOD 10 MG TAB: 10 | 30 days supply | Qty: 30 | Fill #3

## 2018-03-09 DIAGNOSIS — R0683 Snoring: Secondary | ICD-10-CM | POA: Diagnosis not present

## 2018-03-09 NOTE — Procedures (Signed)
Patient Name: Lynn Gardner, Lynn Gardner Date: 02/22/2018 Gender: Female D.O.B: [birth year] Age (years): 53 Referring Provider: Kevan Ny Height (inches): 34 Interpreting Physician: Baird Lyons MD, ABSM Weight (lbs): 148 RPSGT: Lanae Boast BMI: 26 MRN: [id number] Neck Size: 12.50  CLINICAL INFORMATION Sleep Study Type: NPSG Indication for sleep study: Snoring  Epworth Sleepiness Score: 3  SLEEP STUDY TECHNIQUE As per the AASM Manual for the Scoring of Sleep and Associated Events v2.3 (April 2016) with a hypopnea requiring 4% desaturations.  The channels recorded and monitored were frontal, central and occipital EEG, electrooculogram (EOG), submentalis EMG (chin), nasal and oral airflow, thoracic and abdominal wall motion, anterior tibialis EMG, snore microphone, electrocardiogram, and pulse oximetry.  MEDICATIONS Medications self-administered by patient taken the night of the study : none reported  SLEEP ARCHITECTURE The study was initiated at 10:54:02 PM and ended at 5:03:59 AM.  Sleep onset time was 24.2 minutes and the sleep efficiency was 80.1%%. The total sleep time was 296.3 minutes.  Stage REM latency was 85.0 minutes.  The patient spent 10.2%% of the night in stage N1 sleep, 67.2%% in stage N2 sleep, 7.8%% in stage N3 and 14.9% in REM.  Alpha intrusion was absent.  Supine sleep was 60.75%.  RESPIRATORY PARAMETERS The overall apnea/hypopnea index (AHI) was 6.1 per hour. There were 27 total apneas, including 27 obstructive, 0 central and 0 mixed apneas. There were 3 hypopneas and 1 RERAs.  The AHI during Stage REM sleep was 35.5 per hour.  AHI while supine was 9.7 per hour.  The mean oxygen saturation was 93.6%. The minimum SpO2 during sleep was 80.0%.  moderate snoring was noted during this study.  CARDIAC DATA The 2 lead EKG demonstrated sinus rhythm. The mean heart rate was 62.9 beats per minute. Other EKG findings include: None.  LEG  MOVEMENT DATA The total PLMS were 0 with a resulting PLMS index of 0.0. Associated arousal with leg movement index was 0.0 .  IMPRESSIONS - Mild obstructive sleep apnea occurred during this study (AHI = 6.1/h). - There were insufficient early events to meet protocol requirements for split CPAP titration. - No significant central sleep apnea occurred during this study (CAI = 0.0/h). - Oxygen desaturation was noted during this study (Min O2 = 80.0%). Mean 93.8%. - The patient snored with moderate snoring volume. - No cardiac abnormalities were noted during this study. - Clinically significant periodic limb movements did not occur during sleep. No significant associated arousals.  DIAGNOSIS - Obstructive Sleep Apnea (327.23 [G47.33 ICD-10])  RECOMMENDATIONS - Treatment for very mild OSA is directed by symptoms, considering co-morbidities. Conservative measures might include observation, weight loss, sleep position off back. Other options, including CPAP, a fitted oral appliance, or ENT evaluation, would be based on clinical judgment. - Be careful with alcohol, sedatives and other CNS depressants that may worsen sleep apnea and disrupt normal sleep architecture. - Sleep hygiene should be reviewed to assess factors that may improve sleep quality. - Weight management and regular exercise should be initiated or continued if appropriate.  [Electronically signed] 03/09/2018 12:29 PM  Baird Lyons MD, [place], American Board of Sleep Medicine   NPI: [id number]                          [place]  ELECTRONICALLY SIGNED ON:  03/09/2018, 12:25 PM [place] PH: [phone]   FX: [phone] [place]  OF SLEEP MEDICINE

## 2018-03-18 DIAGNOSIS — N952 Postmenopausal atrophic vaginitis: Secondary | ICD-10-CM | POA: Diagnosis not present

## 2018-03-18 DIAGNOSIS — Z01419 Encounter for gynecological examination (general) (routine) without abnormal findings: Secondary | ICD-10-CM | POA: Diagnosis not present

## 2018-03-18 DIAGNOSIS — Z6826 Body mass index (BMI) 26.0-26.9, adult: Secondary | ICD-10-CM | POA: Diagnosis not present

## 2018-03-18 DIAGNOSIS — Z1231 Encounter for screening mammogram for malignant neoplasm of breast: Secondary | ICD-10-CM | POA: Diagnosis not present

## 2018-03-18 DIAGNOSIS — M8580 Other specified disorders of bone density and structure, unspecified site: Secondary | ICD-10-CM | POA: Diagnosis not present

## 2018-03-18 DIAGNOSIS — Z124 Encounter for screening for malignant neoplasm of cervix: Secondary | ICD-10-CM | POA: Diagnosis not present

## 2018-03-18 MED FILL — OSPHENA 60 MG TABLET: 60 | 30 days supply | Qty: 30 | Fill #0

## 2018-03-29 DIAGNOSIS — M8589 Other specified disorders of bone density and structure, multiple sites: Secondary | ICD-10-CM | POA: Diagnosis not present

## 2018-04-01 MED FILL — ALENDRONATE NA 70 MG TAB: 70 | 84 days supply | Qty: 12 | Fill #0

## 2018-04-02 MED FILL — MONTELUKAST SOD 10 MG TAB: 10 | 30 days supply | Qty: 30 | Fill #1

## 2018-04-25 DIAGNOSIS — G4730 Sleep apnea, unspecified: Secondary | ICD-10-CM | POA: Diagnosis not present

## 2018-04-25 DIAGNOSIS — E559 Vitamin D deficiency, unspecified: Secondary | ICD-10-CM | POA: Diagnosis not present

## 2018-04-25 DIAGNOSIS — R079 Chest pain, unspecified: Secondary | ICD-10-CM | POA: Diagnosis not present

## 2018-04-25 DIAGNOSIS — E785 Hyperlipidemia, unspecified: Secondary | ICD-10-CM | POA: Diagnosis not present

## 2018-04-25 DIAGNOSIS — I10 Essential (primary) hypertension: Secondary | ICD-10-CM | POA: Diagnosis not present

## 2018-04-25 DIAGNOSIS — R0789 Other chest pain: Secondary | ICD-10-CM | POA: Diagnosis not present

## 2018-04-26 MED FILL — AZELASTINE HCL 137 MCG/SPRA: 137 | 50 days supply | Qty: 30 | Fill #0

## 2018-05-02 DIAGNOSIS — D485 Neoplasm of uncertain behavior of skin: Secondary | ICD-10-CM | POA: Diagnosis not present

## 2018-05-02 DIAGNOSIS — L669 Cicatricial alopecia, unspecified: Secondary | ICD-10-CM | POA: Diagnosis not present

## 2018-05-16 DIAGNOSIS — L669 Cicatricial alopecia, unspecified: Secondary | ICD-10-CM | POA: Diagnosis not present

## 2018-05-16 DIAGNOSIS — L661 Lichen planopilaris: Secondary | ICD-10-CM | POA: Diagnosis not present

## 2018-05-16 MED FILL — CLOBETASOL 0.05% TOPICAL LO: 0.05 | 14 days supply | Qty: 59 | Fill #0

## 2018-05-20 MED FILL — MONTELUKAST SOD 10 MG TAB: 10 | 30 days supply | Qty: 30 | Fill #2

## 2018-05-20 MED FILL — AMLODIPINE BESYLATE 5 MG TA: 5 | 90 days supply | Qty: 135 | Fill #2

## 2018-05-20 MED FILL — POTASSIUM CHLORIDE CRYS ER: 20 | 90 days supply | Qty: 180 | Fill #2

## 2018-05-22 DIAGNOSIS — M810 Age-related osteoporosis without current pathological fracture: Secondary | ICD-10-CM | POA: Diagnosis not present

## 2018-05-22 DIAGNOSIS — J300 Vasomotor rhinitis: Secondary | ICD-10-CM | POA: Diagnosis not present

## 2018-05-22 DIAGNOSIS — Z712 Person consulting for explanation of examination or test findings: Secondary | ICD-10-CM | POA: Diagnosis not present

## 2018-05-22 DIAGNOSIS — I10 Essential (primary) hypertension: Secondary | ICD-10-CM | POA: Diagnosis not present

## 2018-07-11 MED FILL — ESTRADIOL 0.1 MG/GM CREA: 0.1 | 90 days supply | Qty: 43 | Fill #0

## 2018-07-11 MED FILL — MONTELUKAST SOD 10 MG TAB: 10 | 90 days supply | Qty: 90 | Fill #0

## 2018-07-22 DIAGNOSIS — L669 Cicatricial alopecia, unspecified: Secondary | ICD-10-CM | POA: Diagnosis not present

## 2018-07-22 DIAGNOSIS — L089 Local infection of the skin and subcutaneous tissue, unspecified: Secondary | ICD-10-CM | POA: Diagnosis not present

## 2018-07-22 DIAGNOSIS — L661 Lichen planopilaris: Secondary | ICD-10-CM | POA: Diagnosis not present

## 2018-08-09 MED FILL — RECTASMOOTHE 5% CREAM: 5 | 20 days supply | Qty: 30 | Fill #1

## 2018-08-09 MED FILL — AZELASTINE HCL 137 MCG SPRY: 0.1 | 50 days supply | Qty: 30 | Fill #1

## 2018-08-12 MED FILL — AMLODIPINE BESYLATE 5 MG TA: 5 | 90 days supply | Qty: 135 | Fill #3

## 2018-08-28 ENCOUNTER — Other Ambulatory Visit: Payer: Self-pay

## 2018-08-28 DIAGNOSIS — N950 Postmenopausal bleeding: Secondary | ICD-10-CM | POA: Diagnosis not present

## 2018-08-28 DIAGNOSIS — M810 Age-related osteoporosis without current pathological fracture: Secondary | ICD-10-CM | POA: Diagnosis not present

## 2018-08-29 DIAGNOSIS — L639 Alopecia areata, unspecified: Secondary | ICD-10-CM | POA: Diagnosis not present

## 2018-08-29 DIAGNOSIS — E785 Hyperlipidemia, unspecified: Secondary | ICD-10-CM | POA: Diagnosis not present

## 2018-08-29 DIAGNOSIS — E559 Vitamin D deficiency, unspecified: Secondary | ICD-10-CM | POA: Diagnosis not present

## 2018-08-29 DIAGNOSIS — I10 Essential (primary) hypertension: Secondary | ICD-10-CM | POA: Diagnosis not present

## 2018-09-09 MED FILL — AZITHROMYCIN 250 MG TABLET: 250 | 5 days supply | Qty: 6 | Fill #0

## 2018-09-16 MED FILL — PROGESTERONE 200 MG CAPSULE: 200 | 90 days supply | Qty: 10 | Fill #0

## 2018-09-25 MED FILL — MONTELUKAST SOD 10 MG TAB: 10 | 90 days supply | Qty: 90 | Fill #0 | Status: TO

## 2018-11-07 MED FILL — POTASSIUM CHLORIDE CRYS ER: 20 | 90 days supply | Qty: 180 | Fill #0

## 2018-11-07 MED FILL — AMLODIPINE BESYLATE 5 MG TA: 5 | 90 days supply | Qty: 135 | Fill #0

## 2018-11-28 DIAGNOSIS — I10 Essential (primary) hypertension: Secondary | ICD-10-CM | POA: Diagnosis not present

## 2018-11-28 DIAGNOSIS — E785 Hyperlipidemia, unspecified: Secondary | ICD-10-CM | POA: Diagnosis not present

## 2018-11-28 MED FILL — DONEPEZIL HCL 5 MG TABLET: 5 | 90 days supply | Qty: 90 | Fill #0

## 2018-12-03 MED FILL — CLOBETASOL PROPIONATE 0.05: 0.05 | 30 days supply | Qty: 90 | Fill #0

## 2018-12-03 MED FILL — PROGESTERONE MICRONIZED 200: 200 | 90 days supply | Qty: 10 | Fill #0

## 2018-12-03 MED FILL — AZELASTINE HCL 137 MCG SPRY: 0.1 | 50 days supply | Qty: 30 | Fill #2

## 2018-12-03 MED FILL — ESTRADIOL 0.1 MG/GM CREA: 0.1 | 90 days supply | Qty: 43 | Fill #1

## 2019-01-29 MED FILL — MONTELUKAST SOD 10 MG TAB: 10 | 90 days supply | Qty: 90 | Fill #0

## 2019-01-31 MED FILL — POTASSIUM CHLORIDE CRYS ER: 20 | 90 days supply | Qty: 180 | Fill #1

## 2019-01-31 MED FILL — AMLODIPINE BESYLATE 5 MG TA: 5 | 90 days supply | Qty: 135 | Fill #1

## 2019-02-11 DIAGNOSIS — E785 Hyperlipidemia, unspecified: Secondary | ICD-10-CM | POA: Diagnosis not present

## 2019-02-11 DIAGNOSIS — E559 Vitamin D deficiency, unspecified: Secondary | ICD-10-CM | POA: Diagnosis not present

## 2019-02-11 DIAGNOSIS — G4730 Sleep apnea, unspecified: Secondary | ICD-10-CM | POA: Diagnosis not present

## 2019-02-11 DIAGNOSIS — F0150 Vascular dementia without behavioral disturbance: Secondary | ICD-10-CM | POA: Diagnosis not present

## 2019-02-11 DIAGNOSIS — L639 Alopecia areata, unspecified: Secondary | ICD-10-CM | POA: Diagnosis not present

## 2019-02-11 DIAGNOSIS — I10 Essential (primary) hypertension: Secondary | ICD-10-CM | POA: Diagnosis not present

## 2019-03-03 MED FILL — PROGESTERONE MICRONIZED 200: 200 | 90 days supply | Qty: 10 | Fill #1

## 2019-04-02 DIAGNOSIS — Z124 Encounter for screening for malignant neoplasm of cervix: Secondary | ICD-10-CM | POA: Diagnosis not present

## 2019-04-02 DIAGNOSIS — N6320 Unspecified lump in the left breast, unspecified quadrant: Secondary | ICD-10-CM | POA: Diagnosis not present

## 2019-04-02 DIAGNOSIS — Z01419 Encounter for gynecological examination (general) (routine) without abnormal findings: Secondary | ICD-10-CM | POA: Diagnosis not present

## 2019-04-02 DIAGNOSIS — Z1231 Encounter for screening mammogram for malignant neoplasm of breast: Secondary | ICD-10-CM | POA: Diagnosis not present

## 2019-04-02 DIAGNOSIS — N644 Mastodynia: Secondary | ICD-10-CM | POA: Diagnosis not present

## 2019-04-02 DIAGNOSIS — Z6825 Body mass index (BMI) 25.0-25.9, adult: Secondary | ICD-10-CM | POA: Diagnosis not present

## 2019-04-02 MED FILL — DOXYCYCLINE HYCLATE 100 MG: 100 | 7 days supply | Qty: 14 | Fill #0

## 2019-04-03 ENCOUNTER — Other Ambulatory Visit: Payer: Self-pay | Admitting: Obstetrics and Gynecology

## 2019-04-03 DIAGNOSIS — N6320 Unspecified lump in the left breast, unspecified quadrant: Secondary | ICD-10-CM

## 2019-04-08 DIAGNOSIS — G4730 Sleep apnea, unspecified: Secondary | ICD-10-CM | POA: Diagnosis not present

## 2019-04-08 DIAGNOSIS — E559 Vitamin D deficiency, unspecified: Secondary | ICD-10-CM | POA: Diagnosis not present

## 2019-04-08 DIAGNOSIS — E785 Hyperlipidemia, unspecified: Secondary | ICD-10-CM | POA: Diagnosis not present

## 2019-04-08 DIAGNOSIS — Z23 Encounter for immunization: Secondary | ICD-10-CM | POA: Diagnosis not present

## 2019-04-08 DIAGNOSIS — R748 Abnormal levels of other serum enzymes: Secondary | ICD-10-CM | POA: Diagnosis not present

## 2019-04-08 DIAGNOSIS — F0150 Vascular dementia without behavioral disturbance: Secondary | ICD-10-CM | POA: Diagnosis not present

## 2019-04-08 DIAGNOSIS — I10 Essential (primary) hypertension: Secondary | ICD-10-CM | POA: Diagnosis not present

## 2019-04-11 ENCOUNTER — Other Ambulatory Visit: Payer: Self-pay

## 2019-04-11 ENCOUNTER — Ambulatory Visit
Admission: RE | Admit: 2019-04-11 | Discharge: 2019-04-11 | Disposition: A | Discharge status: Home / Self Care | Payer: 59 | Source: Ambulatory Visit | Attending: Obstetrics and Gynecology | Admitting: Obstetrics and Gynecology

## 2019-04-11 DIAGNOSIS — N6489 Other specified disorders of breast: Secondary | ICD-10-CM | POA: Diagnosis not present

## 2019-04-11 DIAGNOSIS — N6320 Unspecified lump in the left breast, unspecified quadrant: Secondary | ICD-10-CM

## 2019-04-11 DIAGNOSIS — R928 Other abnormal and inconclusive findings on diagnostic imaging of breast: Secondary | ICD-10-CM | POA: Diagnosis not present

## 2019-04-14 ENCOUNTER — Other Ambulatory Visit: Payer: 59

## 2019-04-18 MED FILL — ROSUVASTATIN CALCIUM 10 MG: 10 | 30 days supply | Qty: 30 | Fill #0

## 2019-05-19 MED FILL — AZELASTINE HCL 137 MCG SPRY: 0.1 | 50 days supply | Qty: 30 | Fill #0

## 2019-05-19 MED FILL — POTASSIUM CHLORIDE CRYS ER: 20 | 90 days supply | Qty: 180 | Fill #2

## 2019-05-19 MED FILL — AMLODIPINE BESYLATE 5 MG TA: 5 | 90 days supply | Qty: 135 | Fill #2

## 2019-05-19 MED FILL — ESTRADIOL 0.1 MG/GM CREA: 0.1 | 90 days supply | Qty: 43 | Fill #2

## 2019-05-22 MED FILL — PROGESTERONE MICRONIZED 200: 200 | 90 days supply | Qty: 10 | Fill #2

## 2019-05-26 MED FILL — MONTELUKAST SOD 10 MG TAB: 10 | 90 days supply | Qty: 90 | Fill #1

## 2019-07-01 DIAGNOSIS — G4730 Sleep apnea, unspecified: Secondary | ICD-10-CM | POA: Diagnosis not present

## 2019-07-01 DIAGNOSIS — M545 Low back pain: Secondary | ICD-10-CM | POA: Diagnosis not present

## 2019-07-01 DIAGNOSIS — J300 Vasomotor rhinitis: Secondary | ICD-10-CM | POA: Diagnosis not present

## 2019-07-01 DIAGNOSIS — E559 Vitamin D deficiency, unspecified: Secondary | ICD-10-CM | POA: Diagnosis not present

## 2019-07-01 DIAGNOSIS — F0150 Vascular dementia without behavioral disturbance: Secondary | ICD-10-CM | POA: Diagnosis not present

## 2019-07-01 DIAGNOSIS — E785 Hyperlipidemia, unspecified: Secondary | ICD-10-CM | POA: Diagnosis not present

## 2019-07-01 DIAGNOSIS — I10 Essential (primary) hypertension: Secondary | ICD-10-CM | POA: Diagnosis not present

## 2019-07-02 MED FILL — DICLOFENAC SODIUM 1 % GEL: 1 | 25 days supply | Qty: 100 | Fill #0

## 2019-07-16 DIAGNOSIS — H524 Presbyopia: Secondary | ICD-10-CM | POA: Diagnosis not present

## 2019-08-21 MED FILL — AMLODIPINE BESYLATE 5 MG TA: 5 | 90 days supply | Qty: 135 | Fill #0

## 2019-08-21 MED FILL — PROGESTERONE MICRONIZED 200: 200 | 90 days supply | Qty: 10 | Fill #3

## 2019-08-28 DIAGNOSIS — Z03818 Encounter for observation for suspected exposure to other biological agents ruled out: Secondary | ICD-10-CM | POA: Diagnosis not present

## 2019-09-23 DIAGNOSIS — Z6826 Body mass index (BMI) 26.0-26.9, adult: Secondary | ICD-10-CM | POA: Diagnosis not present

## 2019-09-23 DIAGNOSIS — J300 Vasomotor rhinitis: Secondary | ICD-10-CM | POA: Diagnosis not present

## 2019-09-23 DIAGNOSIS — E559 Vitamin D deficiency, unspecified: Secondary | ICD-10-CM | POA: Diagnosis not present

## 2019-09-23 DIAGNOSIS — G4730 Sleep apnea, unspecified: Secondary | ICD-10-CM | POA: Diagnosis not present

## 2019-09-23 DIAGNOSIS — E785 Hyperlipidemia, unspecified: Secondary | ICD-10-CM | POA: Diagnosis not present

## 2019-09-23 DIAGNOSIS — M545 Low back pain: Secondary | ICD-10-CM | POA: Diagnosis not present

## 2019-09-23 DIAGNOSIS — I10 Essential (primary) hypertension: Secondary | ICD-10-CM | POA: Diagnosis not present

## 2019-09-23 DIAGNOSIS — G479 Sleep disorder, unspecified: Secondary | ICD-10-CM | POA: Diagnosis not present

## 2019-11-04 ENCOUNTER — Other Ambulatory Visit (HOSPITAL_COMMUNITY): Payer: Self-pay | Admitting: Obstetrics and Gynecology

## 2019-11-04 MED FILL — ESTRADIOL 0.1 MG/GM CREA: 0.1 | 90 days supply | Qty: 43 | Fill #0

## 2019-11-04 MED FILL — AMLODIPINE BESYLATE 5 MG TA: 5 | 90 days supply | Qty: 135 | Fill #1

## 2019-11-04 MED FILL — ROSUVASTATIN CALCIUM 10 MG: 10 | 30 days supply | Qty: 30 | Fill #1

## 2019-11-05 MED FILL — POTASSIUM CHLORIDE CRYS ER: 20 | 90 days supply | Qty: 180 | Fill #0

## 2019-11-11 MED FILL — PROGESTERONE MICRONIZED 200: 200 | 90 days supply | Qty: 30 | Fill #0

## 2019-11-26 DIAGNOSIS — I10 Essential (primary) hypertension: Secondary | ICD-10-CM | POA: Diagnosis not present

## 2019-11-26 DIAGNOSIS — E785 Hyperlipidemia, unspecified: Secondary | ICD-10-CM | POA: Diagnosis not present

## 2019-12-25 DIAGNOSIS — I10 Essential (primary) hypertension: Secondary | ICD-10-CM | POA: Diagnosis not present

## 2019-12-25 DIAGNOSIS — M545 Low back pain: Secondary | ICD-10-CM | POA: Diagnosis not present

## 2019-12-25 DIAGNOSIS — E559 Vitamin D deficiency, unspecified: Secondary | ICD-10-CM | POA: Diagnosis not present

## 2019-12-25 DIAGNOSIS — J300 Vasomotor rhinitis: Secondary | ICD-10-CM | POA: Diagnosis not present

## 2019-12-25 DIAGNOSIS — R413 Other amnesia: Secondary | ICD-10-CM | POA: Diagnosis not present

## 2019-12-25 DIAGNOSIS — K5900 Constipation, unspecified: Secondary | ICD-10-CM | POA: Diagnosis not present

## 2019-12-25 DIAGNOSIS — G4730 Sleep apnea, unspecified: Secondary | ICD-10-CM | POA: Diagnosis not present

## 2019-12-25 DIAGNOSIS — E785 Hyperlipidemia, unspecified: Secondary | ICD-10-CM | POA: Diagnosis not present

## 2019-12-25 DIAGNOSIS — L639 Alopecia areata, unspecified: Secondary | ICD-10-CM | POA: Diagnosis not present

## 2019-12-31 ENCOUNTER — Other Ambulatory Visit (HOSPITAL_COMMUNITY): Payer: Self-pay | Admitting: Internal Medicine

## 2019-12-31 MED FILL — AZELASTINE HCL 137 MCG SPRY: 0.1 | 50 days supply | Qty: 30 | Fill #1

## 2019-12-31 MED FILL — TRIAMTERENE-HCTZ 37.5-25 MG: 37.5-25 | 90 days supply | Qty: 90 | Fill #0

## 2020-02-04 MED FILL — MONTELUKAST SOD 10 MG TAB: 10 | 90 days supply | Qty: 90 | Fill #0

## 2020-02-23 ENCOUNTER — Other Ambulatory Visit (HOSPITAL_COMMUNITY): Payer: Self-pay | Admitting: Internal Medicine

## 2020-02-23 MED FILL — POTASSIUM CHLORIDE CRYS ER: 20 | 90 days supply | Qty: 180 | Fill #0

## 2020-02-23 MED FILL — AMLODIPINE BESYLATE 5 MG TA: 5 | 90 days supply | Qty: 135 | Fill #2

## 2020-03-25 DIAGNOSIS — Z6826 Body mass index (BMI) 26.0-26.9, adult: Secondary | ICD-10-CM | POA: Diagnosis not present

## 2020-03-25 DIAGNOSIS — F0150 Vascular dementia without behavioral disturbance: Secondary | ICD-10-CM | POA: Diagnosis not present

## 2020-03-25 DIAGNOSIS — I10 Essential (primary) hypertension: Secondary | ICD-10-CM | POA: Diagnosis not present

## 2020-03-25 DIAGNOSIS — E785 Hyperlipidemia, unspecified: Secondary | ICD-10-CM | POA: Diagnosis not present

## 2020-03-25 DIAGNOSIS — R413 Other amnesia: Secondary | ICD-10-CM | POA: Diagnosis not present

## 2020-03-25 DIAGNOSIS — M5451 Vertebrogenic low back pain: Secondary | ICD-10-CM | POA: Diagnosis not present

## 2020-03-25 DIAGNOSIS — G4730 Sleep apnea, unspecified: Secondary | ICD-10-CM | POA: Diagnosis not present

## 2020-03-25 DIAGNOSIS — J300 Vasomotor rhinitis: Secondary | ICD-10-CM | POA: Diagnosis not present

## 2020-03-31 ENCOUNTER — Other Ambulatory Visit (HOSPITAL_COMMUNITY): Payer: Self-pay | Admitting: Obstetrics and Gynecology

## 2020-03-31 DIAGNOSIS — Z6826 Body mass index (BMI) 26.0-26.9, adult: Secondary | ICD-10-CM | POA: Diagnosis not present

## 2020-03-31 DIAGNOSIS — Z1231 Encounter for screening mammogram for malignant neoplasm of breast: Secondary | ICD-10-CM | POA: Diagnosis not present

## 2020-03-31 DIAGNOSIS — Z01419 Encounter for gynecological examination (general) (routine) without abnormal findings: Secondary | ICD-10-CM | POA: Diagnosis not present

## 2020-03-31 DIAGNOSIS — N951 Menopausal and female climacteric states: Secondary | ICD-10-CM | POA: Diagnosis not present

## 2020-03-31 MED FILL — PROGESTERONE MICRONIZED 200: 200 | 90 days supply | Qty: 30 | Fill #0

## 2020-03-31 MED FILL — ESTRADIOL 0.1 MG/GM CREA: 0.1 | 90 days supply | Qty: 43 | Fill #0

## 2020-05-10 ENCOUNTER — Other Ambulatory Visit (HOSPITAL_COMMUNITY): Payer: Self-pay | Admitting: Obstetrics and Gynecology

## 2020-05-11 MED FILL — LIDOCAINE 5 % CRM: 5 | 10 days supply | Qty: 30 | Fill #0

## 2020-05-12 ENCOUNTER — Other Ambulatory Visit (HOSPITAL_COMMUNITY): Payer: Self-pay | Admitting: Internal Medicine

## 2020-05-12 MED FILL — AZELASTINE HCL 137 MCG SPRY: 0.1 | 50 days supply | Qty: 30 | Fill #2

## 2020-05-12 MED FILL — AMLODIPINE BESYLATE 5 MG TA: 5 | 90 days supply | Qty: 135 | Fill #0

## 2020-06-30 ENCOUNTER — Other Ambulatory Visit (HOSPITAL_COMMUNITY): Payer: Self-pay | Admitting: Internal Medicine

## 2020-06-30 MED FILL — POTASSIUM CHLORIDE CRYS ER: 20 | 90 days supply | Qty: 180 | Fill #1

## 2020-06-30 MED FILL — ROSUVASTATIN CALCIUM 10 MG: 10 | 90 days supply | Qty: 90 | Fill #0

## 2020-06-30 MED FILL — TRIAMTERENE-HCTZ 37.5-25 MG: 37.5-25 | 90 days supply | Qty: 90 | Fill #1

## 2020-06-30 MED FILL — PROGESTERONE MICRONIZED 200: 200 | 90 days supply | Qty: 30 | Fill #1

## 2020-07-01 ENCOUNTER — Other Ambulatory Visit (HOSPITAL_COMMUNITY): Payer: Self-pay | Admitting: Internal Medicine

## 2020-07-01 DIAGNOSIS — K5900 Constipation, unspecified: Secondary | ICD-10-CM | POA: Diagnosis not present

## 2020-07-01 DIAGNOSIS — E785 Hyperlipidemia, unspecified: Secondary | ICD-10-CM | POA: Diagnosis not present

## 2020-07-01 DIAGNOSIS — J300 Vasomotor rhinitis: Secondary | ICD-10-CM | POA: Diagnosis not present

## 2020-07-01 DIAGNOSIS — E559 Vitamin D deficiency, unspecified: Secondary | ICD-10-CM | POA: Diagnosis not present

## 2020-07-01 DIAGNOSIS — R599 Enlarged lymph nodes, unspecified: Secondary | ICD-10-CM | POA: Diagnosis not present

## 2020-07-01 DIAGNOSIS — I10 Essential (primary) hypertension: Secondary | ICD-10-CM | POA: Diagnosis not present

## 2020-07-01 DIAGNOSIS — R413 Other amnesia: Secondary | ICD-10-CM | POA: Diagnosis not present

## 2020-07-01 DIAGNOSIS — G479 Sleep disorder, unspecified: Secondary | ICD-10-CM | POA: Diagnosis not present

## 2020-07-01 DIAGNOSIS — Z733 Stress, not elsewhere classified: Secondary | ICD-10-CM | POA: Diagnosis not present

## 2020-07-01 MED FILL — hydrOXYzine HCL 25 MG TABS: 25 | 30 days supply | Qty: 30 | Fill #0

## 2020-08-13 ENCOUNTER — Other Ambulatory Visit (HOSPITAL_COMMUNITY): Payer: Self-pay | Admitting: Pharmacist

## 2020-08-13 MED FILL — CARESTART COVID-19 HOME TES: 4 days supply | Qty: 4 | Fill #0

## 2020-08-13 MED FILL — AMLODIPINE BESYLATE 5 MG TA: 5 | 90 days supply | Qty: 135 | Fill #1

## 2020-08-23 DIAGNOSIS — H524 Presbyopia: Secondary | ICD-10-CM | POA: Diagnosis not present

## 2020-09-27 ENCOUNTER — Other Ambulatory Visit (HOSPITAL_COMMUNITY): Payer: Self-pay

## 2020-09-27 MED ORDER — MONTELUKAST SODIUM 10 MG PO TABS
10.0000 mg | ORAL_TABLET | Freq: Every day | ORAL | 0 refills | Status: DC
  Filled 2020-09-27: qty 90, 90d supply, fill #0

## 2020-09-28 ENCOUNTER — Other Ambulatory Visit (HOSPITAL_COMMUNITY): Payer: Self-pay

## 2020-10-19 ENCOUNTER — Other Ambulatory Visit (HOSPITAL_COMMUNITY): Payer: Self-pay

## 2020-10-19 DIAGNOSIS — R7303 Prediabetes: Secondary | ICD-10-CM | POA: Diagnosis not present

## 2020-10-19 DIAGNOSIS — L639 Alopecia areata, unspecified: Secondary | ICD-10-CM | POA: Diagnosis not present

## 2020-10-19 DIAGNOSIS — J4520 Mild intermittent asthma, uncomplicated: Secondary | ICD-10-CM | POA: Diagnosis not present

## 2020-10-19 DIAGNOSIS — J301 Allergic rhinitis due to pollen: Secondary | ICD-10-CM | POA: Diagnosis not present

## 2020-10-19 DIAGNOSIS — G479 Sleep disorder, unspecified: Secondary | ICD-10-CM | POA: Diagnosis not present

## 2020-10-19 DIAGNOSIS — I10 Essential (primary) hypertension: Secondary | ICD-10-CM | POA: Diagnosis not present

## 2020-10-19 DIAGNOSIS — G4733 Obstructive sleep apnea (adult) (pediatric): Secondary | ICD-10-CM | POA: Diagnosis not present

## 2020-10-19 DIAGNOSIS — E782 Mixed hyperlipidemia: Secondary | ICD-10-CM | POA: Diagnosis not present

## 2020-10-19 DIAGNOSIS — Z733 Stress, not elsewhere classified: Secondary | ICD-10-CM | POA: Diagnosis not present

## 2020-10-19 MED ORDER — ZOLPIDEM TARTRATE 5 MG PO TABS
5.0000 mg | ORAL_TABLET | Freq: Every evening | ORAL | 1 refills | Status: DC | PRN
  Filled 2020-10-19: qty 30, 30d supply, fill #0
  Filled 2021-02-16: qty 30, 30d supply, fill #1

## 2020-11-17 ENCOUNTER — Other Ambulatory Visit (HOSPITAL_COMMUNITY): Payer: Self-pay

## 2020-11-17 MED FILL — Amlodipine Besylate Tab 5 MG (Base Equivalent): ORAL | 90 days supply | Qty: 135 | Fill #0 | Status: AC

## 2021-01-14 ENCOUNTER — Other Ambulatory Visit (HOSPITAL_COMMUNITY): Payer: Self-pay

## 2021-01-14 MED FILL — Potassium Chloride Microencapsulated Crys ER Tab 20 mEq: ORAL | 90 days supply | Qty: 180 | Fill #0 | Status: AC

## 2021-01-21 ENCOUNTER — Other Ambulatory Visit (HOSPITAL_COMMUNITY): Payer: Self-pay

## 2021-01-21 MED ORDER — AZELASTINE HCL 137 MCG/SPRAY NA SOLN
1.0000 | Freq: Two times a day (BID) | NASAL | 6 refills | Status: DC
  Filled 2021-01-21: qty 30, 25d supply, fill #0
  Filled 2021-05-01: qty 30, 25d supply, fill #1
  Filled 2021-07-22: qty 30, 25d supply, fill #2
  Filled 2021-10-16: qty 30, 25d supply, fill #3
  Filled 2022-01-09: qty 30, 25d supply, fill #4

## 2021-02-16 ENCOUNTER — Other Ambulatory Visit (HOSPITAL_COMMUNITY): Payer: Self-pay | Admitting: Internal Medicine

## 2021-02-17 ENCOUNTER — Other Ambulatory Visit (HOSPITAL_COMMUNITY): Payer: Self-pay

## 2021-02-18 ENCOUNTER — Other Ambulatory Visit (HOSPITAL_COMMUNITY): Payer: Self-pay

## 2021-02-18 MED ORDER — AMLODIPINE BESYLATE 5 MG PO TABS
5.0000 mg | ORAL_TABLET | Freq: Every day | ORAL | 4 refills | Status: DC
  Filled 2021-02-18: qty 90, 90d supply, fill #0
  Filled 2021-05-01: qty 90, 90d supply, fill #1
  Filled 2021-07-22: qty 90, 90d supply, fill #2
  Filled 2021-10-16: qty 90, 90d supply, fill #3
  Filled 2022-01-09: qty 90, 90d supply, fill #4

## 2021-03-13 MED FILL — Lidocaine Anorectal Cream 5%: CUTANEOUS | 14 days supply | Qty: 30 | Fill #0 | Status: AC

## 2021-03-14 ENCOUNTER — Other Ambulatory Visit (HOSPITAL_COMMUNITY): Payer: Self-pay

## 2021-03-14 ENCOUNTER — Other Ambulatory Visit (HOSPITAL_BASED_OUTPATIENT_CLINIC_OR_DEPARTMENT_OTHER): Payer: Self-pay

## 2021-03-14 ENCOUNTER — Ambulatory Visit: Payer: Self-pay | Attending: Internal Medicine

## 2021-03-14 DIAGNOSIS — Z23 Encounter for immunization: Secondary | ICD-10-CM

## 2021-03-14 MED ORDER — PFIZER COVID-19 VAC BIVALENT 30 MCG/0.3ML IM SUSP
INTRAMUSCULAR | 0 refills | Status: AC
  Filled 2021-03-14: qty 0.3, 1d supply, fill #0

## 2021-03-14 NOTE — Progress Notes (Signed)
   Covid-19 Vaccination Clinic  Name:  Lynn Gardner    MRN: [id number] DOB: 01-Aug-1950  03/14/2021  [name] was observed post Covid-19 immunization for 15 minutes without incident. She was provided with Vaccine Information Sheet and instruction to access the V-Safe system.   [name] was instructed to call 911 with any severe reactions post vaccine: Difficulty breathing  Swelling of face and throat  A fast heartbeat  A bad rash all over body  Dizziness and weakness

## 2021-03-15 ENCOUNTER — Other Ambulatory Visit (HOSPITAL_COMMUNITY): Payer: Self-pay

## 2021-05-01 ENCOUNTER — Other Ambulatory Visit (HOSPITAL_COMMUNITY): Payer: Self-pay

## 2021-05-02 ENCOUNTER — Other Ambulatory Visit (HOSPITAL_COMMUNITY): Payer: Self-pay

## 2021-05-02 MED ORDER — MONTELUKAST SODIUM 10 MG PO TABS
10.0000 mg | ORAL_TABLET | Freq: Every day | ORAL | 1 refills | Status: DC
  Filled 2021-05-02: qty 90, 90d supply, fill #0

## 2021-07-22 ENCOUNTER — Other Ambulatory Visit (HOSPITAL_COMMUNITY): Payer: Self-pay

## 2021-07-22 MED ORDER — ZOLPIDEM TARTRATE 5 MG PO TABS
5.0000 mg | ORAL_TABLET | Freq: Every evening | ORAL | 1 refills | Status: AC | PRN
Start: 2021-07-22 — End: ?
  Filled 2021-07-22: qty 30, 30d supply, fill #0
  Filled 2022-01-09: qty 30, 30d supply, fill #1

## 2021-07-25 ENCOUNTER — Other Ambulatory Visit (HOSPITAL_COMMUNITY): Payer: Self-pay

## 2021-07-26 ENCOUNTER — Other Ambulatory Visit (HOSPITAL_COMMUNITY): Payer: Self-pay

## 2021-07-29 ENCOUNTER — Other Ambulatory Visit (HOSPITAL_COMMUNITY): Payer: Self-pay

## 2021-07-29 MED ORDER — POTASSIUM CHLORIDE CRYS ER 20 MEQ PO TBCR
20.0000 meq | EXTENDED_RELEASE_TABLET | Freq: Two times a day (BID) | ORAL | 2 refills | Status: DC
  Filled 2021-07-29: qty 180, 90d supply, fill #0
  Filled 2022-01-09: qty 180, 90d supply, fill #1
  Filled 2022-05-14: qty 180, 90d supply, fill #2

## 2021-10-17 ENCOUNTER — Other Ambulatory Visit (HOSPITAL_COMMUNITY): Payer: Self-pay

## 2021-11-25 ENCOUNTER — Other Ambulatory Visit (HOSPITAL_COMMUNITY): Payer: Self-pay

## 2021-11-25 MED ORDER — AZITHROMYCIN 250 MG PO TABS
ORAL_TABLET | ORAL | 0 refills | Status: AC
  Filled 2021-11-25: qty 6, 5d supply, fill #0

## 2021-11-25 MED ORDER — BACLOFEN 10 MG PO TABS
10.0000 mg | ORAL_TABLET | Freq: Two times a day (BID) | ORAL | 1 refills | Status: AC | PRN
  Filled 2021-11-25: qty 30, 15d supply, fill #0

## 2022-01-09 ENCOUNTER — Other Ambulatory Visit (HOSPITAL_COMMUNITY): Payer: Self-pay

## 2022-03-09 ENCOUNTER — Other Ambulatory Visit (HOSPITAL_COMMUNITY): Payer: Self-pay

## 2022-03-09 MED ORDER — PAXLOVID (300/100) 20 X 150 MG & 10 X 100MG PO TBPK
3.0000 | ORAL_TABLET | Freq: Two times a day (BID) | ORAL | 0 refills | Status: AC
Start: 2022-03-09 — End: ?
  Filled 2022-03-09: qty 30, 5d supply, fill #0

## 2022-04-18 ENCOUNTER — Other Ambulatory Visit (HOSPITAL_COMMUNITY): Payer: Self-pay

## 2022-04-18 MED ORDER — ESTRADIOL 10 MCG VA TABS
10.0000 ug | ORAL_TABLET | VAGINAL | 4 refills | Status: AC
Start: 2022-04-18 — End: ?
  Filled 2022-04-18: qty 11, 30d supply, fill #0

## 2022-04-19 ENCOUNTER — Other Ambulatory Visit (HOSPITAL_COMMUNITY): Payer: Self-pay

## 2022-04-19 ENCOUNTER — Other Ambulatory Visit: Payer: Self-pay | Admitting: Obstetrics and Gynecology

## 2022-04-19 DIAGNOSIS — M810 Age-related osteoporosis without current pathological fracture: Secondary | ICD-10-CM

## 2022-04-19 MED ORDER — AZELASTINE HCL 137 MCG/SPRAY NA SOLN
1.0000 | Freq: Two times a day (BID) | NASAL | 6 refills | Status: DC
  Filled 2022-04-19: qty 30, 25d supply, fill #0
  Filled 2022-07-27: qty 30, 25d supply, fill #1
  Filled 2022-10-02: qty 30, 25d supply, fill #2
  Filled 2023-04-12: qty 30, 25d supply, fill #3

## 2022-04-19 MED ORDER — LIDOCAINE 5 % EX CREA
1.0000 | TOPICAL_CREAM | Freq: Three times a day (TID) | CUTANEOUS | 2 refills | Status: DC | PRN
  Filled 2022-04-19: qty 30, 30d supply, fill #0

## 2022-04-19 MED ORDER — AMLODIPINE BESYLATE 5 MG PO TABS
5.0000 mg | ORAL_TABLET | Freq: Every day | ORAL | 4 refills | Status: DC
Start: 2022-04-19 — End: 2023-07-31
  Filled 2022-04-19: qty 90, 90d supply, fill #0
  Filled 2022-07-27: qty 90, 90d supply, fill #1
  Filled 2022-10-19: qty 90, 90d supply, fill #2
  Filled 2023-01-08: qty 90, 90d supply, fill #3
  Filled 2023-04-12: qty 90, 90d supply, fill #4

## 2022-04-19 MED ORDER — LIDOCAINE 5 % EX OINT
TOPICAL_OINTMENT | Freq: Three times a day (TID) | CUTANEOUS | 3 refills | Status: AC | PRN
  Filled 2022-04-19: qty 30, 30d supply, fill #0
  Filled 2022-10-02 – 2022-10-06 (×2): qty 35.44, 30d supply, fill #1
  Filled 2022-10-09: qty 30, 30d supply, fill #1

## 2022-04-20 ENCOUNTER — Other Ambulatory Visit (HOSPITAL_COMMUNITY): Payer: Self-pay

## 2022-04-21 ENCOUNTER — Other Ambulatory Visit (HOSPITAL_COMMUNITY): Payer: Self-pay

## 2022-04-24 ENCOUNTER — Other Ambulatory Visit (HOSPITAL_COMMUNITY): Payer: Self-pay

## 2022-05-14 ENCOUNTER — Other Ambulatory Visit (HOSPITAL_COMMUNITY): Payer: Self-pay

## 2022-05-15 ENCOUNTER — Other Ambulatory Visit (HOSPITAL_COMMUNITY): Payer: Self-pay

## 2022-05-15 MED ORDER — MONTELUKAST SODIUM 10 MG PO TABS
10.0000 mg | ORAL_TABLET | Freq: Every day | ORAL | 1 refills | Status: AC
  Filled 2022-05-15: qty 90, 90d supply, fill #0
  Filled 2023-04-12: qty 90, 90d supply, fill #1

## 2022-05-15 MED ORDER — ZOLPIDEM TARTRATE 5 MG PO TABS
5.0000 mg | ORAL_TABLET | Freq: Every evening | ORAL | 1 refills | Status: DC | PRN
  Filled 2022-05-15: qty 30, 30d supply, fill #0
  Filled 2022-08-20: qty 30, 30d supply, fill #1

## 2022-05-16 ENCOUNTER — Other Ambulatory Visit (HOSPITAL_COMMUNITY): Payer: Self-pay

## 2022-05-16 MED ORDER — TRIAMTERENE-HCTZ 37.5-25 MG PO TABS
1.0000 | ORAL_TABLET | Freq: Every day | ORAL | 3 refills | Status: AC
  Filled 2022-05-16: qty 90, 90d supply, fill #0
  Filled 2023-04-12: qty 90, 90d supply, fill #1

## 2022-05-16 MED ORDER — ROSUVASTATIN CALCIUM 10 MG PO TABS
10.0000 mg | ORAL_TABLET | Freq: Every day | ORAL | 3 refills | Status: AC
  Filled 2022-05-16: qty 90, 90d supply, fill #0
  Filled 2023-04-12: qty 90, 90d supply, fill #1

## 2022-05-17 ENCOUNTER — Other Ambulatory Visit (HOSPITAL_COMMUNITY): Payer: Self-pay

## 2022-05-19 ENCOUNTER — Other Ambulatory Visit (HOSPITAL_COMMUNITY): Payer: Self-pay

## 2022-07-05 ENCOUNTER — Other Ambulatory Visit (HOSPITAL_COMMUNITY): Payer: Self-pay

## 2022-07-05 MED ORDER — PREDNISONE 20 MG PO TABS
20.0000 mg | ORAL_TABLET | Freq: Two times a day (BID) | ORAL | 1 refills | Status: AC
  Filled 2022-07-05: qty 8, 4d supply, fill #0

## 2022-07-10 ENCOUNTER — Other Ambulatory Visit (HOSPITAL_COMMUNITY): Payer: Self-pay

## 2022-07-10 MED ORDER — MELOXICAM 7.5 MG PO TABS
7.5000 mg | ORAL_TABLET | Freq: Every day | ORAL | 1 refills | Status: AC
  Filled 2022-07-10: qty 30, 30d supply, fill #0

## 2022-08-08 ENCOUNTER — Other Ambulatory Visit (HOSPITAL_COMMUNITY): Payer: Self-pay

## 2022-08-08 MED ORDER — CIPROFLOXACIN HCL 250 MG PO TABS
250.0000 mg | ORAL_TABLET | Freq: Two times a day (BID) | ORAL | 0 refills | Status: AC
  Filled 2022-08-08: qty 6, 3d supply, fill #0

## 2022-08-21 ENCOUNTER — Other Ambulatory Visit: Payer: Self-pay

## 2022-08-29 ENCOUNTER — Other Ambulatory Visit (HOSPITAL_COMMUNITY): Payer: Self-pay

## 2022-08-29 MED ORDER — TAMSULOSIN HCL 0.4 MG PO CAPS
0.4000 mg | ORAL_CAPSULE | Freq: Every day | ORAL | 0 refills | Status: AC
  Filled 2022-08-29: qty 30, 30d supply, fill #0

## 2022-10-02 ENCOUNTER — Other Ambulatory Visit (HOSPITAL_COMMUNITY): Payer: Self-pay

## 2022-10-03 ENCOUNTER — Encounter (HOSPITAL_COMMUNITY): Payer: Self-pay

## 2022-10-03 ENCOUNTER — Other Ambulatory Visit (HOSPITAL_COMMUNITY): Payer: Self-pay

## 2022-10-03 ENCOUNTER — Other Ambulatory Visit: Payer: Self-pay

## 2022-10-03 MED ORDER — POTASSIUM CHLORIDE CRYS ER 20 MEQ PO TBCR
20.0000 meq | EXTENDED_RELEASE_TABLET | Freq: Two times a day (BID) | ORAL | 2 refills | Status: DC
  Filled 2022-10-03: qty 180, 90d supply, fill #0
  Filled 2023-01-08: qty 180, 90d supply, fill #1
  Filled 2023-04-12: qty 180, 90d supply, fill #2

## 2022-10-05 ENCOUNTER — Other Ambulatory Visit (HOSPITAL_COMMUNITY): Payer: Self-pay

## 2022-10-06 ENCOUNTER — Other Ambulatory Visit (HOSPITAL_COMMUNITY): Payer: Self-pay

## 2022-10-09 ENCOUNTER — Other Ambulatory Visit (HOSPITAL_COMMUNITY): Payer: Self-pay

## 2022-10-09 ENCOUNTER — Ambulatory Visit
Admission: RE | Admit: 2022-10-09 | Discharge: 2022-10-09 | Disposition: A | Discharge status: Home / Self Care | Payer: Medicare HMO | Source: Ambulatory Visit | Attending: Obstetrics and Gynecology | Admitting: Obstetrics and Gynecology

## 2022-10-09 DIAGNOSIS — M810 Age-related osteoporosis without current pathological fracture: Secondary | ICD-10-CM

## 2022-10-11 ENCOUNTER — Other Ambulatory Visit: Payer: Self-pay

## 2022-10-17 ENCOUNTER — Other Ambulatory Visit: Payer: Self-pay

## 2022-10-17 ENCOUNTER — Other Ambulatory Visit (HOSPITAL_COMMUNITY): Payer: Self-pay

## 2022-11-13 ENCOUNTER — Ambulatory Visit: Payer: Medicare HMO | Admitting: Dermatology

## 2022-11-13 ENCOUNTER — Encounter: Payer: Self-pay | Admitting: Dermatology

## 2022-11-13 VITALS — BP 119/72

## 2022-11-13 DIAGNOSIS — L918 Other hypertrophic disorders of the skin: Secondary | ICD-10-CM | POA: Diagnosis not present

## 2022-11-13 DIAGNOSIS — L820 Inflamed seborrheic keratosis: Secondary | ICD-10-CM | POA: Diagnosis not present

## 2022-11-13 NOTE — Patient Instructions (Addendum)
    Due to recent changes in healthcare laws, you may see results of your pathology and/or laboratory studies on MyChart before the doctors have had a chance to review them. We understand that in some cases there may be results that are confusing or concerning to you. Please understand that not all results are received at the same time and often the doctors may need to interpret multiple results in order to provide you with the best plan of care or course of treatment. Therefore, we ask that you please give us 2 business days to thoroughly review all your results before contacting the office for clarification. Should we see a critical lab result, you will be contacted sooner.   If You Need Anything After Your Visit  If you have any questions or concerns for your doctor, please call our main line at [phone] If no one answers, please leave a voicemail as directed and we will return your call as soon as possible. Messages left after 4 pm will be answered the following business day.   You may also send us a message via MyChart. We typically respond to MyChart messages within 1-2 business days.  For prescription refills, please ask your pharmacy to contact our office. Our fax number is [phone].  If you have an urgent issue when the clinic is closed that cannot wait until the next business day, you can page your doctor at the number below.    Please note that while we do our best to be available for urgent issues outside of office hours, we are not available 24/7.   If you have an urgent issue and are unable to reach us, you may choose to seek medical care at your doctor's office, retail clinic, urgent care center, or emergency room.  If you have a medical emergency, please immediately call 911 or go to the emergency department. In the event of inclement weather, please call our main line at [phone] for an update on the status of any delays or closures.  Dermatology Medication  Tips: Please keep the boxes that topical medications come in in order to help keep track of the instructions about where and how to use these. Pharmacies typically print the medication instructions only on the boxes and not directly on the medication tubes.   If your medication is too expensive, please contact our office at [phone] or send us a message through MyChart.   We are unable to tell what your co-pay for medications will be in advance as this is different depending on your insurance coverage. However, we may be able to find a substitute medication at lower cost or fill out paperwork to get insurance to cover a needed medication.   If a prior authorization is required to get your medication covered by your insurance company, please allow us 1-2 business days to complete this process.  Drug prices often vary depending on where the prescription is filled and some pharmacies may offer cheaper prices.  The website www.goodrx.com contains coupons for medications through different pharmacies. The prices here do not account for what the cost may be with help from insurance (it may be cheaper with your insurance), but the website can give you the price if you did not use any insurance.  - You can print the associated coupon and take it with your prescription to the pharmacy.  - You may also stop by our office during regular business hours and pick up a GoodRx coupon card.  -   If you need your prescription sent electronically to a different pharmacy, notify our office through [place] MyChart or by phone at [phone]    Cryotherapy Aftercare  Wash gently with soap and water everyday.   Apply Vaseline and Band-Aid daily until healed.  

## 2022-11-13 NOTE — Progress Notes (Unsigned)
   New Patient Visit   Subjective  Lynn Gardner is a 72 y.o. female who presents for the following: Growth on the back that she has had for 2-3 years. Part of it broke off. Two growths on the right lower leg for about 1 year. One appears to be lightening.  Skin growths x 2-3 years under the arms. No personal or family history of skin cancer.  The following portions of the chart were reviewed this encounter and updated as appropriate: medications, allergies, medical history  Review of Systems:  No other skin or systemic complaints except as noted in HPI or Assessment and Plan.  Objective  Well appearing patient in no apparent distress; mood and affect are within normal limits.   A focused examination was performed of the following areas: Right lower leg, back, underarms  Relevant exam findings are noted in the Assessment and Plan.  Back, right lower leg (5) Irritated brown waxy plaques  Left Axilla, right axilla (6) Small pedunculated papules    Assessment & Plan     Inflamed seborrheic keratosis (5) Back, right lower leg  Destruction of lesion - Back, right lower leg Complexity: simple   Destruction method: cryotherapy   Informed consent: discussed and consent obtained   Timeout:  patient name, date of birth, surgical site, and procedure verified Lesion destroyed using liquid nitrogen: Yes   Region frozen until ice ball extended beyond lesion: Yes   Outcome: patient tolerated procedure well with no complications   Post-procedure details: wound care instructions given    Skin tags, multiple acquired (6) Left Axilla, right axilla  Destruction of lesion - Left Axilla, right axilla Complexity: simple   Destruction method: cryotherapy   Informed consent: discussed and consent obtained   Timeout:  patient name, date of birth, surgical site, and procedure verified Lesion destroyed using liquid nitrogen: Yes   Region frozen until ice ball extended beyond lesion:  Yes   Outcome: patient tolerated procedure well with no complications   Post-procedure details: wound care instructions given      Return if symptoms worsen or fail to improve.  Jaclynn Guarneri, CMA, am acting as scribe for [employer], DO.   Documentation: I have reviewed the above documentation for accuracy and completeness, and I agree with the above.  Langston Reusing, DO

## 2022-12-11 ENCOUNTER — Other Ambulatory Visit (HOSPITAL_COMMUNITY): Payer: Self-pay

## 2022-12-11 MED ORDER — PAXLOVID (300/100) 20 X 150 MG & 10 X 100MG PO TBPK
ORAL_TABLET | ORAL | 0 refills | Status: AC
  Filled 2022-12-11: qty 30, 5d supply, fill #0

## 2023-01-08 ENCOUNTER — Other Ambulatory Visit (HOSPITAL_COMMUNITY): Payer: Self-pay

## 2023-01-08 MED ORDER — ZOLPIDEM TARTRATE 5 MG PO TABS
5.0000 mg | ORAL_TABLET | Freq: Every evening | ORAL | 1 refills | Status: AC | PRN
  Filled 2023-01-08: qty 30, 30d supply, fill #0
  Filled 2023-04-12: qty 30, 30d supply, fill #1

## 2023-01-09 ENCOUNTER — Other Ambulatory Visit (HOSPITAL_COMMUNITY): Payer: Self-pay

## 2023-02-15 ENCOUNTER — Ambulatory Visit (INDEPENDENT_AMBULATORY_CARE_PROVIDER_SITE_OTHER): Payer: Medicare HMO | Admitting: Dermatology

## 2023-02-15 ENCOUNTER — Other Ambulatory Visit (HOSPITAL_COMMUNITY): Payer: Self-pay

## 2023-02-15 ENCOUNTER — Encounter: Payer: Self-pay | Admitting: Dermatology

## 2023-02-15 VITALS — BP 149/84

## 2023-02-15 DIAGNOSIS — L820 Inflamed seborrheic keratosis: Secondary | ICD-10-CM

## 2023-02-15 DIAGNOSIS — L243 Irritant contact dermatitis due to cosmetics: Secondary | ICD-10-CM | POA: Diagnosis not present

## 2023-02-15 MED ORDER — METHYLPREDNISOLONE 4 MG PO TBPK
ORAL_TABLET | ORAL | 0 refills | Status: AC
  Filled 2023-02-15: qty 21, 6d supply, fill #0

## 2023-02-15 MED ORDER — HYDROCORTISONE 2.5 % EX OINT
TOPICAL_OINTMENT | Freq: Two times a day (BID) | CUTANEOUS | 0 refills | Status: AC
  Filled 2023-02-15: qty 28.35, 30d supply, fill #0

## 2023-02-15 NOTE — Patient Instructions (Addendum)
Thank you for visiting my office today. I appreciate your dedication to addressing and improving your skin health.  Here is a summary of the key instructions from today's consultation:  - Medications Prescribed:   - Hydrocortisone 2.5% Ointment: Apply morning and night for up to 10 days. If the rash clears within four days, you may discontinue use early. If the rash persists after 10 days, please schedule a follow-up visit.   - Medrol Dose Pack: Begin with all six pills on the first day, then decrease the number of pills each day following the sequence (5, 4, 3, 2, 1). Be mindful of your blood pressure, salt intake, and stress levels, as this medication can elevate blood sugar.  - Skincare Instructions:   - Continue with your current cleanser (number seven).   - Refrain from using nighttime creams or any products containing retinol for the next two weeks until the rash clears and stabilizes.  - Procedure Scheduled:   - The partially regrown seborrheic keratosis on your back will be treated by freezing.  Please commence the prescribed treatments immediately and monitor your skin's response. Should you have any questions or if the condition does not improve as anticipated, please do not hesitate to reach out to our office.  Best regards,  Dr. Langston Reusing Dermatology  Cryotherapy Aftercare  Wash gently with soap and water everyday.   Apply Vaseline and Band-Aid daily until healed.

## 2023-02-15 NOTE — Progress Notes (Signed)
   Follow-Up Visit   Subjective  Lynn Gardner is a 72 y.o. female who presents for the following: She had a facial 3 weeks and it irritated her face. It is itchy and tingling. It is better than it was but it is still there.  She had a rash and flushing. The flushing has improved but the rash is still there. She has used benadryl cream and cortisone cream. She uses Number 7 cleanser daily.    The following portions of the chart were reviewed this encounter and updated as appropriate: medications, allergies, medical history  Review of Systems:  No other skin or systemic complaints except as noted in HPI or Assessment and Plan.  Objective  Well appearing patient in no apparent distress; mood and affect are within normal limits.  A focused examination was performed of the following areas: Face  Relevant exam findings are noted in the Assessment and Plan.  Right Lower Back Erythematous stuck-on, waxy papule or plaque    Assessment & Plan   1. Irritant Contact Dermatitis - Assessment: Patient presents with fine, numerous scattered pink pruritic papules on the forehead, cheeks, chin, and neck following a facial at [facility] in [place] three weeks ago. Over-the-counter hydrocortisone and Benadryl cream have not provided relief. - Plan:   a. Prescribe hydrocortisone 2.5% ointment. Apply morning and night for up to 10 days. If it clears in four days, stop early. If not resolved after 10 days, return for follow-up.   b. Prescribe Medrol dose pack. Monitor high blood pressure, salt intake, stress, and blood sugar. Take all six pills in the morning on the first day, then five, four, three, two, one on subsequent days. Start the cream immediately.   c. Continue using number seven cleanser. Avoid nighttime creams or products with retinol for two weeks until the rash clears and stabilizes.  2. Inflamed Seborrheic Keratosis - Assessment: Patient has a partially regrown mole on her  back, which was previously removed. - Plan:   a. Freeze the remaining piece of the inflamed seborrheic keratosis. They will fall away after treatment. An extra dose may be applied if needed.    Inflamed seborrheic keratosis Right Lower Back  Symptomatic, irritating, patient would like treated.  Benign-appearing.  Call clinic for new or changing lesions.    Destruction of lesion - Right Lower Back Complexity: simple   Destruction method: cryotherapy   Informed consent: discussed and consent obtained   Timeout:  patient name, date of birth, surgical site, and procedure verified Lesion destroyed using liquid nitrogen: Yes   Region frozen until ice ball extended beyond lesion: Yes   Outcome: patient tolerated procedure well with no complications   Post-procedure details: wound care instructions given      No follow-ups on file.  I, Joanie Coddington, CMA, am acting as scribe for [employer], DO .   Documentation: I have reviewed the above documentation for accuracy and completeness, and I agree with the above.  Langston Reusing, DO

## 2023-04-24 ENCOUNTER — Other Ambulatory Visit (HOSPITAL_COMMUNITY): Payer: Self-pay

## 2023-04-24 MED ORDER — TINIDAZOLE 500 MG PO TABS
2000.0000 mg | ORAL_TABLET | Freq: Every day | ORAL | 1 refills | Status: AC
  Filled 2023-04-24: qty 8, 2d supply, fill #0
  Filled 2023-05-03: qty 8, 2d supply, fill #1

## 2023-04-24 MED ORDER — FLUCONAZOLE 150 MG PO TABS
150.0000 mg | ORAL_TABLET | ORAL | 1 refills | Status: AC | PRN
  Filled 2023-04-24: qty 1, 3d supply, fill #0
  Filled 2023-05-03: qty 1, 3d supply, fill #1

## 2023-04-25 ENCOUNTER — Other Ambulatory Visit (HOSPITAL_COMMUNITY): Payer: Self-pay

## 2023-05-04 ENCOUNTER — Other Ambulatory Visit: Payer: Self-pay

## 2023-05-31 ENCOUNTER — Other Ambulatory Visit (HOSPITAL_COMMUNITY): Payer: Self-pay | Admitting: Internal Medicine

## 2023-05-31 DIAGNOSIS — E041 Nontoxic single thyroid nodule: Secondary | ICD-10-CM

## 2023-07-13 NOTE — Progress Notes (Signed)
Roanna Banning, MD  Claudean Kinds; P Ir Procedure Requests  PROCEDURE / BIOPSY REVIEW Date: 07/13/23  Requested Biopsy site: Thyroid Reason for request: Nodule Imaging review: OSH Thyroid US on PACS. 05/02/23  Decision: Approved Imaging modality to perform: Ultrasound Schedule with: No sedation / Local anesthetic Schedule for: Any VIR  Additional comments: @VIR : 2.6 cm L inferior TR-4 thyroid nodule.  Please contact me with questions, concerns, or if issue pertaining to this request arise.  Roanna Banning, MD [facility]       Previous Messages    ----- Message ----- From: Claudean Kinds Sent: 07/13/2023   9:51 AM EST To: Claudean Kinds; Ir Procedure Requests Subject: [place] FNA BX THYROID 1ST LESION AFIRMA            Procedure : [place] FNA BX THYROID 1ST LESION AFIRMA  Reason: Thryoid nodule Dx: Thyroid nodule [E04.1 (ICD-10-CM)]    History : US thyroid ( in PACS)  -- order from ofc with US thyroid report under media 1/31  Provider : Gwenyth Bender, MD  Provider contact: [phone]

## 2023-07-24 ENCOUNTER — Ambulatory Visit
Admission: RE | Admit: 2023-07-24 | Discharge: 2023-07-24 | Disposition: A | Discharge status: Home / Self Care | Payer: Medicare HMO | Source: Ambulatory Visit | Attending: Internal Medicine | Admitting: Internal Medicine

## 2023-07-24 ENCOUNTER — Other Ambulatory Visit (HOSPITAL_COMMUNITY)
Admission: RE | Admit: 2023-07-24 | Discharge: 2023-07-24 | Disposition: A | Discharge status: Home / Self Care | Payer: Medicare HMO | Source: Ambulatory Visit | Attending: Interventional Radiology | Admitting: Interventional Radiology

## 2023-07-24 DIAGNOSIS — E041 Nontoxic single thyroid nodule: Secondary | ICD-10-CM

## 2023-07-26 LAB — CYTOLOGY - NON PAP

## 2023-07-27 ENCOUNTER — Other Ambulatory Visit (HOSPITAL_COMMUNITY): Payer: Self-pay

## 2023-07-27 ENCOUNTER — Encounter (HOSPITAL_COMMUNITY): Payer: Self-pay

## 2023-07-27 ENCOUNTER — Other Ambulatory Visit: Payer: Self-pay

## 2023-07-27 MED ORDER — AZELASTINE HCL 137 MCG/SPRAY NA SOLN
1.0000 | Freq: Two times a day (BID) | NASAL | 6 refills | Status: AC
  Filled 2023-07-27: qty 30, 25d supply, fill #0

## 2023-07-27 MED ORDER — POTASSIUM CHLORIDE CRYS ER 20 MEQ PO TBCR
20.0000 meq | EXTENDED_RELEASE_TABLET | Freq: Two times a day (BID) | ORAL | 2 refills | Status: AC
  Filled 2023-07-27: qty 180, 90d supply, fill #0
  Filled 2023-10-24: qty 180, 90d supply, fill #1

## 2023-07-27 MED ORDER — ZOLPIDEM TARTRATE 5 MG PO TABS
5.0000 mg | ORAL_TABLET | Freq: Every evening | ORAL | 1 refills | Status: AC | PRN
  Filled 2023-07-27: qty 30, 30d supply, fill #0
  Filled 2023-10-24: qty 30, 30d supply, fill #1

## 2023-07-31 ENCOUNTER — Other Ambulatory Visit (HOSPITAL_COMMUNITY): Payer: Self-pay

## 2023-07-31 MED ORDER — AMLODIPINE BESYLATE 5 MG PO TABS
5.0000 mg | ORAL_TABLET | Freq: Every day | ORAL | 4 refills | Status: AC
  Filled 2023-07-31: qty 90, 90d supply, fill #0
  Filled 2023-10-24: qty 90, 90d supply, fill #1

## 2023-08-10 ENCOUNTER — Encounter (HOSPITAL_COMMUNITY): Payer: Self-pay

## 2023-08-10 ENCOUNTER — Ambulatory Visit (HOSPITAL_COMMUNITY): Payer: Medicare HMO

## 2023-10-25 ENCOUNTER — Other Ambulatory Visit: Payer: Self-pay

## 2023-10-26 ENCOUNTER — Other Ambulatory Visit (HOSPITAL_COMMUNITY): Payer: Self-pay

## 2024-07-10 ENCOUNTER — Other Ambulatory Visit (HOSPITAL_COMMUNITY): Payer: Self-pay

## 2024-07-10 MED ORDER — ZOLPIDEM TARTRATE 5 MG PO TABS
5.0000 mg | ORAL_TABLET | Freq: Every evening | ORAL | 1 refills | Status: AC | PRN
  Filled 2024-07-10: qty 30, 30d supply, fill #0
